# Patient Record
Sex: Female | Born: 1980 | Race: White | Hispanic: No | Marital: Married | Smoking: Never smoker
Health system: Southern US, Community
[De-identification: ages and names within clinical notes are randomized; demographics above are authoritative.]

## PROBLEM LIST (undated history)

## (undated) DIAGNOSIS — D649 Anemia, unspecified: Secondary | ICD-10-CM

## (undated) DIAGNOSIS — N61 Mastitis without abscess: Secondary | ICD-10-CM

## (undated) DIAGNOSIS — K5190 Ulcerative colitis, unspecified, without complications: Secondary | ICD-10-CM

## (undated) HISTORY — DX: Ulcerative colitis, unspecified, without complications: K51.90

## (undated) HISTORY — DX: Mastitis without abscess: N61.0

## (undated) HISTORY — DX: Anemia, unspecified: D64.9

---

## 2003-08-11 ENCOUNTER — Ambulatory Visit (HOSPITAL_COMMUNITY): Admission: RE | Admit: 2003-08-11 | Discharge: 2003-08-11 | Payer: Self-pay | Admitting: Obstetrics and Gynecology

## 2004-02-10 ENCOUNTER — Encounter (INDEPENDENT_AMBULATORY_CARE_PROVIDER_SITE_OTHER): Payer: Self-pay | Admitting: Specialist

## 2004-02-10 ENCOUNTER — Ambulatory Visit (HOSPITAL_BASED_OUTPATIENT_CLINIC_OR_DEPARTMENT_OTHER): Admission: RE | Admit: 2004-02-10 | Discharge: 2004-02-10 | Payer: Self-pay | Admitting: Obstetrics and Gynecology

## 2004-02-10 ENCOUNTER — Ambulatory Visit (HOSPITAL_COMMUNITY): Admission: RE | Admit: 2004-02-10 | Discharge: 2004-02-10 | Payer: Self-pay | Admitting: Obstetrics and Gynecology

## 2004-10-30 HISTORY — PX: BARTHOLIN GLAND CYST EXCISION: SHX565

## 2005-01-28 ENCOUNTER — Emergency Department (HOSPITAL_COMMUNITY): Admission: EM | Admit: 2005-01-28 | Discharge: 2005-01-28 | Payer: Self-pay | Admitting: Family Medicine

## 2006-07-27 ENCOUNTER — Encounter (INDEPENDENT_AMBULATORY_CARE_PROVIDER_SITE_OTHER): Payer: Self-pay | Admitting: Specialist

## 2006-07-27 ENCOUNTER — Ambulatory Visit (HOSPITAL_COMMUNITY): Admission: RE | Admit: 2006-07-27 | Discharge: 2006-07-27 | Payer: Self-pay | Admitting: Obstetrics and Gynecology

## 2008-06-11 ENCOUNTER — Inpatient Hospital Stay (HOSPITAL_COMMUNITY): Admission: AD | Admit: 2008-06-11 | Discharge: 2008-06-13 | Payer: Self-pay | Admitting: Obstetrics and Gynecology

## 2010-10-30 NOTE — L&D Delivery Note (Signed)
Delivery Note At 9:50 PM a healthy female was delivered via Vaginal, Spontaneous Delivery (Presentation: ;  ).  APGAR: 9, 9; weight 8 lb 13.6 oz (4015 g).   Placenta status: Intact, Spontaneous.  Cord: 3 vessels with the following complications: None.  Cord pH: not done  Anesthesia: Epidural  Episiotomy: None Lacerations: 2nd degree Suture Repair: vicryl vicryl rapide 2-0, 3-0 Est. Blood Loss (mL): 300  Mom to postpartum.  Baby to nursery-stable.  Robynne Roat,MARIE-LYNE 09/08/2011, 10:13 PM

## 2011-03-14 NOTE — H&P (Signed)
NAMEHANAAN, GANCARZ              ACCOUNT NO.:  [id number]   MEDICAL RECORD NO.:  [id number]          PATIENT TYPE:  INP   LOCATION:  9101                          FACILITY:  [facility]   PHYSICIAN:  Lovenia Kim, M.D.DATE OF BIRTH:  07-28-1981   DATE OF ADMISSION:  06/11/2008  DATE OF DISCHARGE:                              HISTORY & PHYSICAL   CHIEF COMPLAINT:  Labor.   She is a 30 year old white female, primiparous at term, at 65 plus weeks  with spontaneous rupture of membranes at midnight and active labor.   ALLERGIES:  She has no known drug allergies.   MEDICATIONS:  Prenatal vitamins.   FAMILY HISTORY:  She has a family history of breast, prostate, colon,  and lymphoid cancer.   SOCIAL HISTORY:  She is a nonsmoker, nondrinker.  She denies domestic or  physical violence.   She had 1 uncomplicated spontaneous abortion in 2006.  Her prenatal  course is again complicated by an estimated fetal weight of  approximately in the 90th percentile, which has been consistent with  trimester and occasional elevation of blood pressure.   PHYSICAL EXAMINATION:  GENERAL:  She is a well-developed and well-  nourished white female, in no acute distress.  HEENT:  Normal.  LUNGS:  Clear.  HEART:  Regular.  ABDOMEN:  Soft, gravid, and nontender.  Estimated fetal weight 8.5  pounds.  Cervix is 10 cm, 100%, vertex, +2.  EXTREMITIES:  No cords.  NEUROLOGIC:  Nonfocal.  SKIN:  Intact.   IMPRESSION:  1. Term intrauterine pregnancy in active labor.  2. Presumptive large for gestational age.  3. Group B Streptococcus positive, on prophylaxis.   PLAN:  Anticipated vaginal delivery.      Lovenia Kim, M.D.  Electronically Signed     RJT/MEDQ  D:  06/11/2008  T:  06/12/2008  Job:  [id number]

## 2011-03-14 NOTE — Op Note (Signed)
Barbara Hall, Barbara Hall              ACCOUNT NO.:  [id number]   MEDICAL RECORD NO.:  [id number]          PATIENT TYPE:  INP   LOCATION:  9101                          FACILITY:  [facility]   PHYSICIAN:  Lovenia Kim, M.D.DATE OF BIRTH:  12/24/1980   DATE OF PROCEDURE:  06/11/2008  DATE OF DISCHARGE:                               OPERATIVE REPORT   INDICATION FOR OPERATIVE DELIVERY:  Maternal exhaustion and prolonged  second stage.   POSTOPERATIVE DIAGNOSES:  1. Maternal exhaustion.  2. Prolonged second stage.   PROCEDURE:  Outlet vacuum-assisted vaginal delivery with Kiwi cup x1  pull.   SURGEON:  Lovenia Kim, MD   ANESTHESIA:  Epidural.   ESTIMATED BLOOD LOSS:  600 mL.   COMPLICATIONS:  None.   DRAINS:  None.   COUNTS:  Correct.   The patient to Recovery in good condition.   FINDINGS:  Full-term living female, Apgars 8 and 23.  Placenta delivered  spontaneously intact.  A 3-vessel cord noted.  Third-degree laceration,  with repair.   DESCRIPTION OF PROCEDURE:  After being apprised of the risks and  benefits of vacuum assistance to include small instance of scalp  laceration and intracranial hemorrhage and cephalohematoma, Kiwi cup was  placed, fetal vertex OA, +3/+4 station for one pull of vacuum-assisted  delivery of a full-term living female over a third-degree laceration,  controlled, Apgars 8 and 9.  Placenta delivered spontaneously intact, 3-  vessel cord noted.  Bulb suctioning performed, repaired with a 3-0  Rapide and a 0 Vicryl for sphincter repair.  Good hemostasis is noted.  Rectum is intact.  No complications.  No cervical lacerations.  The  patient tolerated the procedure well and is recovering with baby in good  condition.     Lovenia Kim, M.D.  Electronically Signed    RJT/MEDQ  D:  06/11/2008  T:  06/12/2008  Job:  [id number]

## 2011-03-17 NOTE — Op Note (Signed)
NAME:  Barbara Hall, Barbara Hall                         ACCOUNT NO.:  [id number]   MEDICAL RECORD NO.:  [id number]                   PATIENT TYPE:  AMB   LOCATION:  [facility]                                 FACILITY:  [facility]   PHYSICIAN:  Selinda Orion, M.D.               DATE OF BIRTH:  1981-09-05   DATE OF PROCEDURE:  02/10/2004  DATE OF DISCHARGE:                                 OPERATIVE REPORT   PREOPERATIVE DIAGNOSIS:  Labial cyst.   POSTOPERATIVE DIAGNOSIS:  Bartholin's gland cyst.   OPERATION/PROCEDURE:  Drainage of cyst and marsupialization.   INDICATIONS:  The patient is a 30 year old female who had Bartholin's  abscess, was drained and developed a large cyst in her labia.  I thought  this was simply a simple inclusion cyst.  However, it appears that she may  have a Bartholin's cyst.   DESCRIPTION OF PROCEDURE:  The patient was prepped and draped in the usual  fashion.  A midline incision was made over the labia and the Bartholin's  gland opened.  It was a large, 3 cm cyst.  It contained the typical clear  Bartholin's fluid.  I excised some of the cyst wall for diagnosis and then  drained the cyst vaginally by placing a hemostat inside the Bartholin's cyst  and draining it, finding an opening in the vaginal mucosa.  The Bartholin's  gland was then sutured to the vaginal mucosa with interrupted sutures to  bring it to enhance drainage.  The labial incision was then closed with a  subcuticular 4-0 Vicryl and the marsupialized area was closed with  interrupted sutures of 4-0 Vicryl.  The hemostat was left in place to be  sure that it communicated with the area satisfactorily and it did.  Hemostasis appeared secure.  I infiltrated the gland with some Marcaine.  Satcha tolerated this procedure well.  Was sent to the recovery room in  good condition.                                               Selinda Orion, M.D.    SDM/MEDQ  D:  02/10/2004  T:  02/10/2004  Job:  [id number]

## 2011-03-17 NOTE — Op Note (Signed)
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   POSTOPERATIVE DIAGNOSIS:  Persistent Bartholin abscess refractory to  marsupialization x2.   PROCEDURE:  Excision of Bartholin cyst and Bartholin's gland.   SURGEON:  Lovenia Kim, M.D.   ASSISTANT:  __________   ESTIMATED BLOOD LOSS:  100 mL.   COMPLICATIONS:  None.   ANESTHESIA:  General.   SPECIMENS:  Bartholin abscess, cyst, and glands to pathology.   DISPOSITION:  Patient to recovery in good condition.   BRIEF OP NOTE:  The patient apprised of the risks of anesthesia, infection,  bleeding, inability to completely remove the Bartholin cyst with increased  risks for blood loss, and possible perineal discomfort.  The patient was  brought to the operating room where she was administered general anesthetic  for complications and prepped and draped in the usual sterile fashion.  Patient was catheterized until the bladder was empty.  Examination reveals a  3 x 4 cm Bartholin abscess previously marsupialized.  At this time a linear  incision is made over the labia minora over the top of the cyst.  The  subcutaneous tissue is dissected sharply and the cyst and gland are  enucleated dissecting around the cyst and keeping it intact until reaching  the base, at which point the base is excised from the cavity where the gland  is lying and the bleeding was in the bases controlled using electrocautery  and multiple interrupted sutures of #3-0 Vicryl are placed achieving good  hemostasis.  The dead space is further closed using 3-0 Vicryl  interrupted  mattress sutures.  At this time having achieved good hemostasis the labia  minora is closed using a subcutaneous  stitch of a 4-0 Vicryl in a continuous running fashion and a subcuticular  fashion.  Good hemostasis is noted.  The patient tolerates the procedure  well and was transferred to recovery in good condition.   DESCRIPTION OF PROCEDURE:  __________      Lovenia Kim, M.D.  Electronically Signed     RJT/MEDQ  D:  07/27/2006  T:  07/29/2006  Job:  [id number]

## 2011-03-17 NOTE — Op Note (Signed)
   NAME:  Barbara Hall, Barbara Hall                         ACCOUNT NO.:  [id number]   MEDICAL RECORD NO.:  [id number]                   PATIENT TYPE:  AMB   LOCATION:  [facility]                                  FACILITY:  [facility]   PHYSICIAN:  Selinda Orion, M.D.               DATE OF BIRTH:  06/04/1981   DATE OF PROCEDURE:  08/11/2003  DATE OF DISCHARGE:                                 OPERATIVE REPORT   PREOPERATIVE DIAGNOSIS:  Labial abscess.   POSTOPERATIVE DIAGNOSIS:  Labial abscess, probable Bartholin's abscess.   OPERATION:  Incision and drainage of abscess and insertion of Word catheter.   The patient was placed in the supine position and prepped and draped and  anesthetized, and then the labial abscess was draining.  This was enlarged  and a Word catheter was inserted and sutured in with one suture of 3-0  Vicryl.  The area was then injected with 1% Xylocaine with epinephrine.  No  unusual blood loss occurred.  Kijana tolerated this procedure well.                                               Selinda Orion, M.D.    SDM/MEDQ  D:  08/11/2003  T:  08/11/2003  Job:  [id number]

## 2011-03-17 NOTE — H&P (Signed)
NAMECASSADIE, PANKONIN               ACCOUNT NO.:  [id number]   MEDICAL RECORD NO.:  [id number]          PATIENT TYPE:  AMB   LOCATION:  [place]                           FACILITY:  [facility]   PHYSICIAN:  Lovenia Kim, M.D.DATE OF BIRTH:  05-18-1981   DATE OF ADMISSION:  DATE OF DISCHARGE:                                HISTORY & PHYSICAL   A 30 year old white female with a persistent Bartholin cyst. She has no  known drug allergies.   MEDICATIONS:  None.   FAMILY HISTORY:  Breast cancer, colon cancer, prostate cancer. She has a  history of Bartholin cyst with cystectomy in 2005. She has a noncontributory  pregnancy history.   PHYSICAL EXAMINATION:  GENERAL:  She is a well-developed, well-nourished,  white female in no acute distress.  HEENT:  Normal.  LUNGS:  Clear.  HEART:  Regular rate and rhythm.  ABDOMEN:  Soft, nontender.  PELVIC:  Reveals a 3 x 4 tender inflamed Bartholin cyst which is status post  surgery x2.  EXTREMITIES:  Revealed no cords.  NEUROLOGIC:  Nonfocal.   IMPRESSION:  Persistent right Bartholin abscess.   PLAN:  Will proceed with excision of Bartholin abscess. The risks of  anesthesia, infection, bleeding, injury to abdominal organs and need for  repair was discussed. Delayed versus immediate complications to include  bowel and bladder injury were noted, inability to completely remove cyst  noted. The patient acknowledges and wishes to proceed.      Lovenia Kim, M.D.  Electronically Signed     RJT/MEDQ  D:  07/26/2006  T:  07/27/2006  Job:  [id number]

## 2011-07-28 LAB — CBC
HCT: 28 — ABNORMAL LOW
HCT: 36.6
Hemoglobin: 12.1
Hemoglobin: 9.5 — ABNORMAL LOW
MCHC: 33
MCHC: 33.8
MCV: 87
MCV: 87.3
Platelets: 165
Platelets: 216
RBC: 3.22 — ABNORMAL LOW
RBC: 4.19
RDW: 14.1
RDW: 14.6
WBC: 11.9 — ABNORMAL HIGH
WBC: 13.3 — ABNORMAL HIGH

## 2011-07-28 LAB — RPR: RPR Ser Ql: NONREACTIVE

## 2011-07-28 LAB — CCBB MATERNAL DONOR DRAW

## 2011-09-08 ENCOUNTER — Inpatient Hospital Stay (HOSPITAL_COMMUNITY)
Admission: AD | Admit: 2011-09-08 | Discharge: 2011-09-10 | DRG: 373 | Disposition: A | Discharge status: Home / Self Care | Payer: BC Managed Care – PPO | Source: Ambulatory Visit | Attending: Obstetrics & Gynecology | Admitting: Obstetrics & Gynecology

## 2011-09-08 ENCOUNTER — Encounter (HOSPITAL_COMMUNITY): Payer: Self-pay | Admitting: *Deleted

## 2011-09-08 ENCOUNTER — Encounter (HOSPITAL_COMMUNITY): Payer: Self-pay | Admitting: Anesthesiology

## 2011-09-08 ENCOUNTER — Inpatient Hospital Stay (HOSPITAL_COMMUNITY): Payer: BC Managed Care – PPO | Admitting: Anesthesiology

## 2011-09-08 DIAGNOSIS — Z22330 Carrier of Group B streptococcus: Secondary | ICD-10-CM

## 2011-09-08 DIAGNOSIS — O99892 Other specified diseases and conditions complicating childbirth: Secondary | ICD-10-CM | POA: Diagnosis present

## 2011-09-08 LAB — CBC
HCT: 37.3 % (ref 36.0–46.0)
Hemoglobin: 11.6 g/dL — ABNORMAL LOW (ref 12.0–15.0)
MCH: 23.9 pg — ABNORMAL LOW (ref 26.0–34.0)
MCHC: 31.1 g/dL (ref 30.0–36.0)
MCV: 76.7 fL — ABNORMAL LOW (ref 78.0–100.0)
Platelets: 277 10*3/uL (ref 150–400)
RBC: 4.86 MIL/uL (ref 3.87–5.11)
RDW: 17.3 % — ABNORMAL HIGH (ref 11.5–15.5)
WBC: 9 10*3/uL (ref 4.0–10.5)

## 2011-09-08 LAB — HIV ANTIBODY (ROUTINE TESTING W REFLEX)
HIV: NONREACTIVE
HIV: NONREACTIVE

## 2011-09-08 LAB — ANTIBODY SCREEN: Antibody Screen: NEGATIVE

## 2011-09-08 LAB — RUBELLA ANTIBODY, IGM: Rubella: IMMUNE

## 2011-09-08 LAB — ABO/RH: RH Type: POSITIVE

## 2011-09-08 LAB — HEPATITIS B SURFACE ANTIGEN: Hepatitis B Surface Ag: NEGATIVE

## 2011-09-08 LAB — RPR
RPR: NONREACTIVE
RPR: NONREACTIVE

## 2011-09-08 LAB — STREP B DNA PROBE: GBS: POSITIVE

## 2011-09-08 MED ORDER — DIBUCAINE 1 % RE OINT
1.0000 "application " | TOPICAL_OINTMENT | RECTAL | Status: DC | PRN
  Filled 2011-09-08: qty 28

## 2011-09-08 MED ORDER — EPHEDRINE 5 MG/ML INJ
10.0000 mg | INTRAVENOUS | Status: DC | PRN
  Filled 2011-09-08: qty 4

## 2011-09-08 MED ORDER — PHENYLEPHRINE 40 MCG/ML (10ML) SYRINGE FOR IV PUSH (FOR BLOOD PRESSURE SUPPORT): 80.0000 ug | PREFILLED_SYRINGE | INTRAVENOUS | Status: DC | PRN

## 2011-09-08 MED ORDER — PENICILLIN G POTASSIUM 5000000 UNITS IJ SOLR
5.0000 10*6.[IU] | Freq: Once | INTRAMUSCULAR | Status: AC
  Administered 2011-09-08: 5 10*6.[IU] via INTRAVENOUS
  Filled 2011-09-08: qty 5

## 2011-09-08 MED ORDER — LIDOCAINE HCL 1.5 % IJ SOLN
INTRAMUSCULAR | Status: DC | PRN
  Administered 2011-09-08 (×2): 5 mL via EPIDURAL

## 2011-09-08 MED ORDER — DIPHENHYDRAMINE HCL 25 MG PO CAPS: 25.0000 mg | ORAL_CAPSULE | Freq: Four times a day (QID) | ORAL | Status: DC | PRN

## 2011-09-08 MED ORDER — LACTATED RINGERS IV SOLN: 500.0000 mL | INTRAVENOUS | Status: DC | PRN

## 2011-09-08 MED ORDER — PENICILLIN G POTASSIUM 5000000 UNITS IJ SOLR
2.5000 10*6.[IU] | INTRAVENOUS | Status: DC
  Filled 2011-09-08 (×5): qty 2.5

## 2011-09-08 MED ORDER — CITRIC ACID-SODIUM CITRATE 334-500 MG/5ML PO SOLN: 30.0000 mL | ORAL | Status: DC | PRN

## 2011-09-08 MED ORDER — ACETAMINOPHEN 325 MG PO TABS: 650.0000 mg | ORAL_TABLET | ORAL | Status: DC | PRN

## 2011-09-08 MED ORDER — LACTATED RINGERS IV SOLN: 500.0000 mL | Freq: Once | INTRAVENOUS | Status: DC

## 2011-09-08 MED ORDER — FLEET ENEMA 7-19 GM/118ML RE ENEM: 1.0000 | ENEMA | RECTAL | Status: DC | PRN

## 2011-09-08 MED ORDER — WITCH HAZEL-GLYCERIN EX PADS: 1.0000 "application " | MEDICATED_PAD | CUTANEOUS | Status: DC | PRN

## 2011-09-08 MED ORDER — SIMETHICONE 80 MG PO CHEW: 80.0000 mg | CHEWABLE_TABLET | ORAL | Status: DC | PRN

## 2011-09-08 MED ORDER — IBUPROFEN 600 MG PO TABS
600.0000 mg | ORAL_TABLET | Freq: Four times a day (QID) | ORAL | Status: DC
  Administered 2011-09-09 – 2011-09-10 (×5): 600 mg via ORAL
  Filled 2011-09-08 (×5): qty 1

## 2011-09-08 MED ORDER — DIPHENHYDRAMINE HCL 50 MG/ML IJ SOLN: 12.5000 mg | INTRAMUSCULAR | Status: DC | PRN

## 2011-09-08 MED ORDER — ONDANSETRON HCL 4 MG/2ML IJ SOLN: 4.0000 mg | Freq: Four times a day (QID) | INTRAMUSCULAR | Status: DC | PRN

## 2011-09-08 MED ORDER — EPHEDRINE 5 MG/ML INJ: 10.0000 mg | INTRAVENOUS | Status: DC | PRN

## 2011-09-08 MED ORDER — OXYTOCIN 20 UNITS IN LACTATED RINGERS INFUSION - SIMPLE
125.0000 mL/h | Freq: Once | INTRAVENOUS | Status: AC
  Administered 2011-09-08: 125 mL/h via INTRAVENOUS

## 2011-09-08 MED ORDER — OXYTOCIN BOLUS FROM INFUSION
500.0000 mL | Freq: Once | INTRAVENOUS | Status: DC
  Filled 2011-09-08: qty 500
  Filled 2011-09-08: qty 1000

## 2011-09-08 MED ORDER — BENZOCAINE-MENTHOL 20-0.5 % EX AERO
1.0000 "application " | INHALATION_SPRAY | CUTANEOUS | Status: DC | PRN
  Administered 2011-09-09: 1 via TOPICAL
  Filled 2011-09-08: qty 56

## 2011-09-08 MED ORDER — OXYTOCIN 20 UNITS IN LACTATED RINGERS INFUSION - SIMPLE: 125.0000 mL/h | INTRAVENOUS | Status: DC | PRN

## 2011-09-08 MED ORDER — ONDANSETRON HCL 4 MG/2ML IJ SOLN: 4.0000 mg | INTRAMUSCULAR | Status: DC | PRN

## 2011-09-08 MED ORDER — LACTATED RINGERS IV SOLN
INTRAVENOUS | Status: DC
  Administered 2011-09-08: 19:00:00 via INTRAVENOUS

## 2011-09-08 MED ORDER — IBUPROFEN 600 MG PO TABS
600.0000 mg | ORAL_TABLET | Freq: Four times a day (QID) | ORAL | Status: DC | PRN
  Administered 2011-09-08: 600 mg via ORAL
  Filled 2011-09-08 (×2): qty 1

## 2011-09-08 MED ORDER — OXYCODONE-ACETAMINOPHEN 5-325 MG PO TABS
1.0000 | ORAL_TABLET | ORAL | Status: DC | PRN
  Administered 2011-09-09: 1 via ORAL
  Filled 2011-09-08: qty 1

## 2011-09-08 MED ORDER — FENTANYL 2.5 MCG/ML BUPIVACAINE 1/10 % EPIDURAL INFUSION (WH - ANES)
14.0000 mL/h | INTRAMUSCULAR | Status: DC
Start: 2011-09-08 — End: 2011-09-09
  Administered 2011-09-08: 14 mL/h via EPIDURAL
  Filled 2011-09-08: qty 60

## 2011-09-08 MED ORDER — ONDANSETRON HCL 4 MG PO TABS: 4.0000 mg | ORAL_TABLET | ORAL | Status: DC | PRN

## 2011-09-08 MED ORDER — PRENATAL PLUS 27-1 MG PO TABS
1.0000 | ORAL_TABLET | Freq: Every day | ORAL | Status: DC
  Administered 2011-09-09 – 2011-09-10 (×2): 1 via ORAL
  Filled 2011-09-08 (×2): qty 1

## 2011-09-08 MED ORDER — ZOLPIDEM TARTRATE 5 MG PO TABS: 5.0000 mg | ORAL_TABLET | Freq: Every evening | ORAL | Status: DC | PRN

## 2011-09-08 MED ORDER — DEXTROSE 5 % IV SOLN: 4.0000 10*6.[IU] | INTRAVENOUS | Status: DC

## 2011-09-08 MED ORDER — OXYCODONE-ACETAMINOPHEN 5-325 MG PO TABS: 2.0000 | ORAL_TABLET | ORAL | Status: DC | PRN

## 2011-09-08 MED ORDER — PHENYLEPHRINE 40 MCG/ML (10ML) SYRINGE FOR IV PUSH (FOR BLOOD PRESSURE SUPPORT)
80.0000 ug | PREFILLED_SYRINGE | INTRAVENOUS | Status: DC | PRN
  Filled 2011-09-08: qty 5

## 2011-09-08 MED ORDER — SENNOSIDES-DOCUSATE SODIUM 8.6-50 MG PO TABS: 2.0000 | ORAL_TABLET | Freq: Every day | ORAL | Status: DC

## 2011-09-08 MED ORDER — LIDOCAINE HCL (PF) 1 % IJ SOLN
30.0000 mL | INTRAMUSCULAR | Status: DC | PRN
  Filled 2011-09-08: qty 30

## 2011-09-08 NOTE — Anesthesia Procedure Notes (Signed)

## 2011-09-08 NOTE — Progress Notes (Signed)
Pt states, " I've ben contracting since 3:30 pm and they are every 4 min. I was 4 cm in the office yesterday."

## 2011-09-08 NOTE — H&P (Signed)
Barbara Hall is a 30 y.o. female G3P1A1 38w4dpresenting for spontaneous labor.  RP:  Reg UCs   Obstetrics:  G3P1A1  SVD 38+ wks girl 8+ Lbs, no Cx  Past Medical History  Diagnosis Date  . No pertinent past medical history    Past Surgical History  Procedure Date  . Bartholin gland cyst excision 2006   Family History: family history is not on file. Social History:  reports that she has never smoked. She has never used smokeless tobacco. Her alcohol and drug histories not on file. Current facility-administered medications:acetaminophen (TYLENOL) tablet 650 mg, 650 mg, Oral, Q4H PRN, DDerrell Lolling  citric acid-sodium citrate (ORACIT) solution 30 mL, 30 mL, Oral, Q2H PRN, DDerrell Lolling  diphenhydrAMINE (BENADRYL) injection 12.5 mg, 12.5 mg, Intravenous, Q15 min PRN, FLucy Antigua MD;  ePHEDrine injection 10 mg, 10 mg, Intravenous, PRN, FLucy Antigua MD ePHEDrine injection 10 mg, 10 mg, Intravenous, PRN, FLucy Antigua MD;  fentaNYL 2.5 mcg/ml w/bupivacaine 0.1% epidural (60 ml syringe), 14 mL/hr, Epidural, Continuous, FLucy Antigua MD, Last Rate: 14 mL/hr at 09/08/11 1937, 14 mL/hr at 09/08/11 1937;  ibuprofen (ADVIL,MOTRIN) tablet 600 mg, 600 mg, Oral, Q6H PRN, DDerrell Lolling  lactated ringers infusion 500 mL, 500 mL, Intravenous, Once, FLucy Antigua MD lactated ringers infusion 500-1,000 mL, 500-1,000 mL, Intravenous, PRN, DDerrell Lolling  lactated ringers infusion, , Intravenous, Continuous, DDerrell Lolling Last Rate: 125 mL/hr at 09/08/11 1845;  lidocaine (XYLOCAINE) 1 % injection 30 mL, 30 mL, Subcutaneous, PRN, DDerrell Lolling  ondansetron (ZOFRAN) injection 4 mg, 4 mg, Intravenous, Q6H PRN, DDerrell LollingoxyCODONE-acetaminophen (PERCOCET) 5-325 MG per tablet 2 tablet, 2 tablet, Oral, Q3H PRN, DDerrell Lolling  oxytocin (PITOCIN) IV BOLUS FROM BAG, 500 mL, Intravenous, Once, DDerrell Lolling  oxytocin (PITOCIN) IV infusion 20 units in LR 1000 mL, 125 mL/hr, Intravenous, Once,  DDerrell Lolling  penicillin G potassium 2.5 Million Units in dextrose 5 % 100 mL IVPB, 2.5 Million Units, Intravenous, Q4H, Marie-Lyne Danijah Noh penicillin G potassium 5 Million Units in dextrose 5 % 250 mL IVPB, 5 Million Units, Intravenous, Once, DDerrell Lolling 5 Million Units at 09/08/11 1932;  phenylephrine injection 80 mcg, 80 mcg, Intravenous, PRN, FLucy Antigua MD;  phenylephrine injection 80 mcg, 80 mcg, Intravenous, PRN, FLucy Antigua MD;  sodium phosphate (FLEET) 7-19 GM/118ML enema 1 enema, 1 enema, Rectal, PRN, DDerrell LollingDISCONTD: penicillin G potassium 4 Million Units in dextrose 5 % 250 mL IVPB, 4 Million Units, Intravenous, Q4H, Daniela Paul Facility-Administered Medications Ordered in Other Encounters: lidocaine 1.5 % injection, , , PRN, FLucy Antigua MD, 5 mL at 09/08/11 1935 No Known Allergies  Dilation: 10 Effacement (%): 100 Station: +1 Exam by:: harker RN Blood pressure 138/77, pulse 93, temperature 98 F (36.7 C), temperature source Oral, resp. rate 20, height 5' 7"  (1.702 m), weight 81.761 kg (180 lb 4 oz), SpO2 100.00%, unknown if currently breastfeeding. SROM clear fluid in L+D  HPP: There is no problem list on file for this patient.   Prenatal labs: ABO, Rh: A/Positive/-- (11/09 1855) Antibody: Negative (11/09 1855) Rubella:  Immune RPR: Nonreactive, Nonreactive (11/09 1855)  HBsAg: Negative (11/09 1855)  HIV: Non-reactive, Non-reactive (11/09 1855)  Genetic testing: Quad neg [place]: wnl 1 hr GTT: wnl GBS: Positive (11/09 1855)  Pen G received in labor.  Assessment/Plan: G3P1A1 38+ wks with spontaneous labor/SROM ready for delivery.  FHR monitoring reassuring. GBS pos, Pen G received.   Mell Mellott,MARIE-LYNE 09/08/2011, 10:20 PM

## 2011-09-08 NOTE — Anesthesia Preprocedure Evaluation (Signed)
Anesthesia Evaluation  Patient identified by MRN, date of birth, ID band Patient awake    Reviewed: Allergy & Precautions, H&P , Patient's Chart, lab work & pertinent test results  Airway Mallampati: II TM Distance: >3 FB Neck ROM: full    Dental No notable dental hx.    Pulmonary neg pulmonary ROS,  clear to auscultation  Pulmonary exam normal       Cardiovascular neg cardio ROS regular Normal    Neuro/Psych Negative Neurological ROS  Negative Psych ROS   GI/Hepatic negative GI ROS, Neg liver ROS,   Endo/Other  Negative Endocrine ROS  Renal/GU negative Renal ROS     Musculoskeletal   Abdominal   Peds  Hematology negative hematology ROS (+)   Anesthesia Other Findings   Reproductive/Obstetrics (+) Pregnancy                           Anesthesia Physical Anesthesia Plan  ASA: II  Anesthesia Plan: Epidural   Post-op Pain Management:    Induction:   Airway Management Planned:   Additional Equipment:   Intra-op Plan:   Post-operative Plan:   Informed Consent: I have reviewed the patients History and Physical, chart, labs and discussed the procedure including the risks, benefits and alternatives for the proposed anesthesia with the patient or authorized representative who has indicated his/her understanding and acceptance.     Plan Discussed with:   Anesthesia Plan Comments:         Anesthesia Quick Evaluation

## 2011-09-09 ENCOUNTER — Encounter (HOSPITAL_COMMUNITY): Payer: Self-pay

## 2011-09-09 LAB — CBC
HCT: 30.2 % — ABNORMAL LOW (ref 36.0–46.0)
Hemoglobin: 9.4 g/dL — ABNORMAL LOW (ref 12.0–15.0)
MCH: 24.6 pg — ABNORMAL LOW (ref 26.0–34.0)
MCHC: 31.8 g/dL (ref 30.0–36.0)
MCV: 77.4 fL — ABNORMAL LOW (ref 78.0–100.0)
Platelets: 204 10*3/uL (ref 150–400)
RBC: 3.9 MIL/uL (ref 3.87–5.11)
RDW: 17.3 % — ABNORMAL HIGH (ref 11.5–15.5)
WBC: 16.2 10*3/uL — ABNORMAL HIGH (ref 4.0–10.5)

## 2011-09-09 LAB — RPR: RPR Ser Ql: NONREACTIVE

## 2011-09-09 MED ORDER — METHYLERGONOVINE MALEATE 0.2 MG PO TABS: 0.2000 mg | ORAL_TABLET | ORAL | Status: DC | PRN

## 2011-09-09 MED ORDER — METHYLERGONOVINE MALEATE 0.2 MG/ML IJ SOLN: 0.2000 mg | INTRAMUSCULAR | Status: DC | PRN

## 2011-09-09 MED ORDER — LANOLIN HYDROUS EX OINT: TOPICAL_OINTMENT | CUTANEOUS | Status: DC | PRN

## 2011-09-09 MED ORDER — BENZOCAINE-MENTHOL 20-0.5 % EX AERO
INHALATION_SPRAY | CUTANEOUS | Status: AC
  Filled 2011-09-09: qty 56

## 2011-09-09 MED ORDER — TETANUS-DIPHTH-ACELL PERTUSSIS 5-2.5-18.5 LF-MCG/0.5 IM SUSP
0.5000 mL | Freq: Once | INTRAMUSCULAR | Status: AC
  Administered 2011-09-09: 0.5 mL via INTRAMUSCULAR
  Filled 2011-09-09: qty 0.5

## 2011-09-09 NOTE — Progress Notes (Signed)
PPD 1 SVD  S:  Reports feeling well.             Tolerating po/ No nausea or vomiting             Bleeding is moderate             Pain controlled withprescription NSAID's including motrin             Up ad lib / ambulatory  Newborn  Information for the patient's newborn:  Keniah, Klemmer [[id number]]  female  breast feeding  / Circumcision in progress   O:  A & O x 3 NAD             VS: Blood pressure 118/83, pulse 73, temperature 97.7 F (36.5 C), temperature source Oral, resp. rate 17, height 5' 7"  (1.702 m), weight 81.761 kg (180 lb 4 oz), SpO2 100.00%, unknown if currently breastfeeding.  LABS:  Basename 09/09/11 0601 09/08/11 1850  HGB 9.4* 11.6*  HCT 30.2* 37.3    I&O: I/O last 3 completed shifts: In: -  Out: 300 [Blood:300]      Lungs: Clear and unlabored  Heart: regular rate and rhythm / no mumurs  Abdomen: soft, non-tender, non-distended              Fundus: firm, non-tender, U-1  Perineum: repair intact, mild edema  Lochia: small  Extremities: trace edema, no calf pain or tenderness, neg Homans    A/P: PPD # 1 30 y.o., F7P1025 S/P:spontaneous vaginal   Principal Problem:  *Postpartum examination following vaginal delivery (11/9)   Doing well - stable status  Routine post partum orders  Anticipate discharge home in AM.   PAUL,DANIELA, CNM, MSN 09/09/2011, 11:31 AM

## 2011-09-09 NOTE — Progress Notes (Signed)
Moving pt to room 173

## 2011-09-10 NOTE — Addendum Note (Signed)
Addendum  created 09/10/11 0931 by Lucy Antigua, MD   Modules edited:Notes Section

## 2011-09-10 NOTE — Anesthesia Postprocedure Evaluation (Signed)
Anesthesia Post Note  Patient: Barbara Hall  Procedure(s) Performed: * No procedures listed *  Anesthesia type: Epidural  Patient location: Mother/Baby  Post pain: Pain level controlled  Post assessment: Post-op Vital signs reviewed  Last Vitals:  Filed Vitals:   09/10/11 0540  BP: 111/74  Pulse: 73  Temp: 36.7 C  Resp: 18    Post vital signs: Reviewed  Level of consciousness: awake  Complications: No apparent anesthesia complications

## 2011-09-10 NOTE — Discharge Summary (Signed)
   Obstetric Discharge Summary Reason for Admission: onset of labor Prenatal Procedures: none Intrapartum Procedures: spontaneous vaginal delivery Postpartum Procedures: none Complications-Operative and Postpartum: none  Temp:  [97.9 F (36.6 C)-98.3 F (36.8 C)] 98 F (36.7 C) (11/11 0540) Pulse Rate:  [73-74] 73  (11/11 0540) Resp:  [18-20] 18  (11/11 0540) BP: (111-116)/(74-80) 111/74 mmHg (11/11 0540) Hemoglobin  Date Value Range Status  09/09/2011 9.4* 12.0-15.0 (g/dL) Final     REPEATED TO VERIFY     DELTA CHECK NOTED     HCT  Date Value Range Status  09/09/2011 30.2* 36.0-46.0 (%) Final    Hospital Course:  Good normal postpartum  Discharge Diagnoses: Term Pregnancy-delivered  Discharge Information: Date: 09/10/2011 Activity: Pelvic rest until postpartum visit Diet: routine Medications:  Medication List  As of 09/10/2011  9:38 AM   CONTINUE taking these medications         acetaminophen 325 MG tablet   Commonly known as: TYLENOL      prenatal vitamin w/FE, FA 27-1 MG Tabs           Condition: stable Instructions: refer to practice specific booklet Discharge to: home Follow-up Information    Follow up with Lovenia Kim, MD in 6 weeks.   Contact information:   [facility] [phone]          Newborn Data: Live born  Information for the patient's newborn:  Barbara Hall, Barbara Hall [[id number]]  female circumcised ; APGAR 9-9  ; weight  8+ Lbs Home with mother.  Barbara Hall,MARIE-LYNE 09/10/2011, 9:38 AM

## 2011-09-10 NOTE — Progress Notes (Signed)
PPD #2 SVD  S:  Pt reports feeling well/ Tolerating po/ Voiding without problems/ No n/v/ Bleeding is light/ Pain controlled withibuprofen (OTC)  Newborn  Well, breastfeeding and formula.  D/C after BM today.   O:  A & O x 3 , NAD Filed Vitals:   09/10/11 0540  BP: 111/74  Pulse: 73  Temp: 98 F (36.7 C)  Resp: 18    LABS:  Lab Results  Component Value Date   WBC 16.2* 09/09/2011   HGB 9.4* 09/09/2011   HCT 30.2* 09/09/2011   MCV 77.4* 09/09/2011   PLT 204 09/09/2011      Lungs: clear  Heart: rcr  Abdomen: soft  Perineum: intact  UH: 0/2 firm  Lochia: normal  Extremities: normal    A/P: PPD # 2/ [id number]  Doing well  D/C home, f/u 6 wks.    Princess Bruins MD

## 2011-11-06 ENCOUNTER — Encounter: Payer: Self-pay | Admitting: Gastroenterology

## 2011-11-09 ENCOUNTER — Ambulatory Visit (INDEPENDENT_AMBULATORY_CARE_PROVIDER_SITE_OTHER): Payer: BC Managed Care – PPO | Admitting: Gastroenterology

## 2011-11-09 ENCOUNTER — Encounter: Payer: Self-pay | Admitting: Gastroenterology

## 2011-11-09 VITALS — BP 110/64 | HR 80 | Ht 67.0 in | Wt 147.0 lb

## 2011-11-09 DIAGNOSIS — K5289 Other specified noninfective gastroenteritis and colitis: Secondary | ICD-10-CM

## 2011-11-09 MED ORDER — MESALAMINE 1.2 G PO TBEC: 2.4000 g | DELAYED_RELEASE_TABLET | Freq: Every day | ORAL | Status: DC

## 2011-11-09 MED ORDER — HYOSCYAMINE SULFATE ER 0.375 MG PO TBCR: EXTENDED_RELEASE_TABLET | ORAL | Status: DC

## 2011-11-09 MED ORDER — PREDNISONE 1 MG PO TABS: ORAL_TABLET | ORAL | Status: DC

## 2011-11-09 NOTE — Progress Notes (Signed)
History of Present Illness:  Barbara Hall is a pleasant 31 year old white female with history of unspecified colitis referred at the request of Dr. Alroy Dust for evaluation of bleeding and diarrhea. She was diagnosed with colitis in 2007. Records are not available. 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 She's had about 3 flares over the last 5 years consisting of frequent diarrhea with bleeding and mucus. She is on no medications. Approximately 2 weeks ago she developed crampy lower abdominal pain accompanied by frequent loose stools mixed with blood and mucus. She's been treated with asacol and Rowasa suppositories in the past.    Past Medical History  Diagnosis Date  . Postpartum examination following vaginal delivery (11/9) 09/09/2011  . Hemorrhoids   . Mastitis    Past Surgical History  Procedure Date  . Bartholin gland cyst excision 2006   family history includes Colon cancer in her maternal grandmother and Colon polyps in her mother. No current outpatient prescriptions on file.   Allergies as of 11/09/2011  . (No Known Allergies)    reports that she has quit smoking. She has never used smokeless tobacco. She reports that she does not drink alcohol or use illicit drugs.     Review of Systems: Pertinent positive and negative review of systems were noted in the above HPI section. All other review of systems were otherwise negative.  Vital signs were reviewed in today's medical record Physical Exam: General: Well developed , well nourished, no acute distress Head: Normocephalic and atraumatic Eyes:  sclerae anicteric, EOMI Ears: Normal auditory acuity Mouth: No deformity or lesions Neck: Supple, no masses or thyromegaly Lungs: Clear throughout to auscultation Heart: Regular rate and rhythm; no murmurs, rubs or bruits Abdomen: Soft, non tender and non distended. No masses, hepatosplenomegaly or hernias noted. Normal Bowel sounds Rectal:deferred Musculoskeletal:  Symmetrical with no gross deformities  Skin: No lesions on visible extremities Pulses:  Normal pulses noted Extremities: No clubbing, cyanosis, edema or deformities noted Neurological: Alert oriented x 4, grossly nonfocal Cervical Nodes:  No significant cervical adenopathy Inguinal Nodes: No significant inguinal adenopathy Psychological:  Alert and cooperative. Normal mood and affect

## 2011-11-09 NOTE — Patient Instructions (Signed)
You will need to follow up in 1 week We will obtain records for Dr Kelby Fam review from Dr Michail Sermon and Dr Earlean Shawl We will send in your prescriptions to your pharmacy today

## 2011-11-09 NOTE — Assessment & Plan Note (Addendum)
Patient is having a flareup of her colitis. I am unsure whether she has a left-sided colitis or pancolitis. I suspect the former on the basis that topical therapy was used in the past. She has stated that she was slow to respond to asacol in the past  Recommendations #1 begin lialda 2.4 g a day #2 begin prednisone 20 mg daily; patient was instructed to call back in 5 days to report her progress #3 hyomax when necessary #4 review prior records

## 2011-11-14 ENCOUNTER — Telehealth: Payer: Self-pay | Admitting: Gastroenterology

## 2011-11-14 NOTE — Telephone Encounter (Signed)
ok 

## 2011-11-14 NOTE — Telephone Encounter (Signed)
Pt called and states that she is feeling much better. She will keep her ov appt for Friday.

## 2011-11-17 ENCOUNTER — Ambulatory Visit (INDEPENDENT_AMBULATORY_CARE_PROVIDER_SITE_OTHER): Payer: BC Managed Care – PPO | Admitting: Gastroenterology

## 2011-11-17 ENCOUNTER — Encounter: Payer: Self-pay | Admitting: Gastroenterology

## 2011-11-17 VITALS — BP 100/60 | HR 64 | Ht 67.0 in | Wt 144.0 lb

## 2011-11-17 DIAGNOSIS — K5289 Other specified noninfective gastroenteritis and colitis: Secondary | ICD-10-CM

## 2011-11-17 MED ORDER — PREDNISONE (PAK) 10 MG PO TABS: ORAL_TABLET | ORAL | Status: DC

## 2011-11-17 NOTE — Progress Notes (Signed)
History of Present Illness:  Barbara Hall continues to have diarrhea with bleeding. Abdominal pain is decreased. Although she was supposed to be on 20 mg of prednisone she was taking only 4 mg. She remains on lialda.    Review of Systems: Pertinent positive and negative review of systems were noted in the above HPI section. All other review of systems were otherwise negative.    Current Medications, Allergies, Past Medical History, Past Surgical History, Family History and Social History were reviewed in [place] record  Vital signs were reviewed in today's medical record. Physical Exam: General: Well developed , well nourished, no acute distress    2

## 2011-11-17 NOTE — Assessment & Plan Note (Addendum)
She continues to be symptomatic.  Recommendations #1 continue lialda #2 begin prednisone 20 mg daily. She was instructed to call back in 5 days.

## 2011-11-17 NOTE — Patient Instructions (Signed)
Please call back for a follow appointment in 3 weeks

## 2011-11-21 ENCOUNTER — Telehealth: Payer: Self-pay

## 2011-11-21 NOTE — Telephone Encounter (Signed)
Increase to 39m qd, c/b Friday

## 2011-11-21 NOTE — Telephone Encounter (Signed)
Pt states she was instructed to call back and give an update of her condition. Pt states she has been taking prednisone 37m/day for 5 days. Pt reports that she is still having diarrhea. She had 6 diarrhea stools in 3 hours last night. Dr. KDeatra Inaplease advise.

## 2011-11-21 NOTE — Telephone Encounter (Signed)
Pt aware.

## 2011-11-27 ENCOUNTER — Telehealth: Payer: Self-pay | Admitting: Gastroenterology

## 2011-11-27 DIAGNOSIS — R197 Diarrhea, unspecified: Secondary | ICD-10-CM

## 2011-11-27 MED ORDER — PREDNISONE 20 MG PO TABS: 40.0000 mg | ORAL_TABLET | Freq: Every day | ORAL | Status: AC

## 2011-11-27 NOTE — Telephone Encounter (Signed)
Pt scheduled for previsit for 11/28/11@4pm , Flex-sig scheduled for 11/29/11@11am . Pt aware of appt dates and times. Rx sent to the pharmacy, lab order entered.

## 2011-11-27 NOTE — Telephone Encounter (Signed)
R/n prednisone, continue other meds She needs sigmoidoscopy ASAP Please get stool for C. dificile

## 2011-11-27 NOTE — Telephone Encounter (Signed)
Pt was increased to prednisone 38m daily last week. She is out of prednisone now. She is still taking her lialda. Pt states that she is still running to the bathroom every time she eats. She states that her stool is getting a little bit firmer but at times she is still seeing some blood and mucous present. Pt wants to know what she should do with her medications. Please advise.

## 2011-11-28 ENCOUNTER — Ambulatory Visit (AMBULATORY_SURGERY_CENTER): Payer: BC Managed Care – PPO | Admitting: *Deleted

## 2011-11-28 VITALS — Ht 67.0 in | Wt 147.0 lb

## 2011-11-28 DIAGNOSIS — K5289 Other specified noninfective gastroenteritis and colitis: Secondary | ICD-10-CM

## 2011-11-29 ENCOUNTER — Encounter: Payer: Self-pay | Admitting: Gastroenterology

## 2011-11-29 ENCOUNTER — Ambulatory Visit (AMBULATORY_SURGERY_CENTER): Payer: BC Managed Care – PPO | Admitting: Gastroenterology

## 2011-11-29 VITALS — BP 116/72 | HR 77 | Temp 96.6°F | Resp 18 | Ht 67.0 in | Wt 147.0 lb

## 2011-11-29 DIAGNOSIS — K5289 Other specified noninfective gastroenteritis and colitis: Secondary | ICD-10-CM

## 2011-11-29 DIAGNOSIS — K5100 Ulcerative (chronic) pancolitis without complications: Secondary | ICD-10-CM

## 2011-11-29 MED ORDER — MESALAMINE 1.2 G PO TBEC: 4.8000 g | DELAYED_RELEASE_TABLET | Freq: Every day | ORAL | Status: DC

## 2011-11-29 MED ORDER — SODIUM CHLORIDE 0.9 % IV SOLN: 500.0000 mL | INTRAVENOUS | Status: DC

## 2011-11-29 NOTE — Op Note (Signed)
[facility] [address] [place], [place]  [id number]  FLEXIBLE SIGMOIDOSCOPY PROCEDURE REPORT  PATIENT:  Barbara, Hall  MR#:  [id number] BIRTHDATE:  1981/03/29, 30 yrs. old  GENDER:  female  ENDOSCOPIST:  Sandy Salaam. Deatra Ina, MD Referred by:  Donnie Coffin, M.D.  PROCEDURE DATE:  11/29/2011 PROCEDURE:  Flexible Sigmoidoscopy with biopsy ASA CLASS:  Class II INDICATIONS:  diarrhea, ulcerative colitis  MEDICATIONS:   MAC sedation, administered by CRNA propofol 252mIV  DESCRIPTION OF PROCEDURE:   After the risks benefits and alternatives of the procedure were thoroughly explained, informed consent was obtained.  Digital rectal exam was performed and revealed no abnormalities.   The [id number] was introduced through the anus and advanced to the descending colon, without limitations.  The quality of the prep was .  The instrument was then slowly withdrawn as the mucosa was fully examined. <<PROCEDUREIMAGES>>  Colitis was found. Diffusely inflamed, erythematous mucosa with excess mucus, mucosal friability, areas of hemorrhage and superficial ulcers. Bxs taken (see image1, image2, image3, image5, image7, and image8).   Retroflexed views in the rectum revealed ulceration.    The scope was then withdrawn from the patient and the procedure terminated.  COMPLICATIONS:  None  ENDOSCOPIC IMPRESSION: 1) Moderately Active Pancolitis RECOMMENDATIONS: 1) continue current meds 2) Call the office to schedule a followup office visit for 1 week  REPEAT EXAM:  No  ______________________________ RSandy Salaam KDeatra Ina MD  CC:  n. eSIGNED:   RSandy Salaam Jamile Rekowski at 11/29/2011 11:30 AM  SMilagros Loll [phone]

## 2011-11-29 NOTE — Progress Notes (Signed)
Patient did not experience any of the following events: a burn prior to discharge; a fall within the facility; wrong site/side/patient/procedure/implant event; or a hospital transfer or hospital admission upon discharge from the facility. (G8907) Patient did not have preoperative order for IV antibiotic SSI prophylaxis. (G8918)  

## 2011-11-29 NOTE — Patient Instructions (Addendum)
Low fiber, lactose-free diet. Call office in 5 days for followup. Continue Current Meds   Please follow all discharge instructions given to you by the recovery room nurse. If you have any questions or problems after discharge please call [phone]. You will receive a phone call in the am to see how you are doing and answer any questions you may have. Thank you for choosing [facility] for your health care needs.

## 2011-11-29 NOTE — Progress Notes (Signed)
Propofol was administered by Tilman Neat, CRNA. Maw  The pt tolerated the flex sigmoidoscopy very well. Maw

## 2011-11-30 ENCOUNTER — Telehealth: Payer: Self-pay | Admitting: *Deleted

## 2011-11-30 NOTE — Telephone Encounter (Signed)
  Follow up Call-  Call back number 11/29/2011  Post procedure Call Back phone  # [phone]  Permission to leave phone message Yes     Patient questions:  Do you have a fever, pain , or abdominal swelling? no Pain Score  0 *  Have you tolerated food without any problems? yes  Have you been able to return to your normal activities? yes  Do you have any questions about your discharge instructions: Diet   no Medications  no Follow up visit  no  Do you have questions or concerns about your Care? no  Actions: * If pain score is 4 or above: No action needed, pain <4.

## 2011-12-04 ENCOUNTER — Encounter: Payer: Self-pay | Admitting: Gastroenterology

## 2011-12-06 ENCOUNTER — Telehealth: Payer: Self-pay | Admitting: Gastroenterology

## 2011-12-07 NOTE — Telephone Encounter (Signed)
Left message for pt to call back  °

## 2011-12-11 NOTE — Telephone Encounter (Signed)
Left message for pt to call back  °

## 2011-12-12 ENCOUNTER — Encounter: Payer: Self-pay | Admitting: Gastroenterology

## 2011-12-12 ENCOUNTER — Ambulatory Visit (INDEPENDENT_AMBULATORY_CARE_PROVIDER_SITE_OTHER): Payer: BC Managed Care – PPO | Admitting: Gastroenterology

## 2011-12-12 ENCOUNTER — Other Ambulatory Visit: Payer: Self-pay | Admitting: Gastroenterology

## 2011-12-12 DIAGNOSIS — K5150 Left sided colitis without complications: Secondary | ICD-10-CM | POA: Insufficient documentation

## 2011-12-12 DIAGNOSIS — K5289 Other specified noninfective gastroenteritis and colitis: Secondary | ICD-10-CM

## 2011-12-12 DIAGNOSIS — K5190 Ulcerative colitis, unspecified, without complications: Secondary | ICD-10-CM

## 2011-12-12 MED ORDER — PREDNISONE (PAK) 10 MG PO TABS: ORAL_TABLET | ORAL | Status: DC

## 2011-12-12 MED ORDER — MESALAMINE 1.2 G PO TBEC: 4.8000 g | DELAYED_RELEASE_TABLET | Freq: Every day | ORAL | Status: DC

## 2011-12-12 NOTE — Telephone Encounter (Signed)
Pt came in for OV appt today with Dr. Deatra Ina.

## 2011-12-12 NOTE — Telephone Encounter (Signed)
Called pharmacy and gave script verbally

## 2011-12-12 NOTE — Progress Notes (Signed)
History of Present Illness:  Barbara Hall has returned for followup of colitis. She has pancolitis and finally has responded to 40 mg of prednisone. This is in addition to lialda. She's having solid bowel movements and she is without abdominal pain.    Review of Systems: Pertinent positive and negative review of systems were noted in the above HPI section. All other review of systems were otherwise negative.    Current Medications, Allergies, Past Medical History, Past Surgical History, Family History and Social History were reviewed in Barbara Hall record  Vital signs were reviewed in today's medical record. Physical Exam: General: Well developed , well nourished, no acute distress

## 2011-12-12 NOTE — Assessment & Plan Note (Signed)
She's had a recent flareup which has responded to steroids and mesalamine.  Recommendations #1 continue lialda; begin prednisone taper

## 2011-12-12 NOTE — Patient Instructions (Signed)
Begin prednisone 30 mg daily for 5 days, then Prednisone 20 mg daily for 5 days, then Prednisone 15 mg daily for 5 days, then Prednisone 10 mg daily for 7 days, then  prednisone 10 mg alternating with 5 mg daily for 7 days, then Prednisone 5 mg daily for 7 days, then Prednisone 5 mg every other day for 7 days, and then discontinue

## 2012-01-24 ENCOUNTER — Ambulatory Visit: Payer: BC Managed Care – PPO | Admitting: Gastroenterology

## 2012-01-25 ENCOUNTER — Telehealth: Payer: Self-pay | Admitting: Gastroenterology

## 2012-01-25 NOTE — Telephone Encounter (Signed)
Pt missed her appt and wanted to call and give [place] an update. Pt states she has tapered off of her Prednisone and is feeling fine. She reports that she is symptom free. Pt states she will call us back if she needs an appt.

## 2012-05-20 ENCOUNTER — Telehealth: Payer: Self-pay | Admitting: Gastroenterology

## 2012-05-20 NOTE — Telephone Encounter (Signed)
Yes, it's an option if she prefers

## 2012-05-20 NOTE — Telephone Encounter (Signed)
Pt called and states that the Lialda is very expensive and she wonders if Delzicol would be less expensive. Pt wants to know if this is an option for her. Please advise.

## 2012-05-20 NOTE — Telephone Encounter (Signed)
What dosage of Delzicol would you like to prescribe?

## 2012-05-21 MED ORDER — MESALAMINE 400 MG PO CPDR: 800.0000 mg | DELAYED_RELEASE_CAPSULE | Freq: Three times a day (TID) | ORAL | Status: DC

## 2012-05-21 NOTE — Telephone Encounter (Signed)
Spoke with pt and rx sent to pharmacy.

## 2012-05-21 NOTE — Telephone Encounter (Signed)
89m tid

## 2013-12-11 ENCOUNTER — Ambulatory Visit (INDEPENDENT_AMBULATORY_CARE_PROVIDER_SITE_OTHER): Payer: No Typology Code available for payment source | Admitting: Physician Assistant

## 2013-12-11 ENCOUNTER — Encounter: Payer: Self-pay | Admitting: Physician Assistant

## 2013-12-11 VITALS — BP 100/60 | HR 66 | Ht 67.0 in | Wt 152.6 lb

## 2013-12-11 DIAGNOSIS — K5100 Ulcerative (chronic) pancolitis without complications: Secondary | ICD-10-CM

## 2013-12-11 MED ORDER — MESALAMINE 1.2 G PO TBEC: 1.2000 g | DELAYED_RELEASE_TABLET | Freq: Every day | ORAL | Status: DC

## 2013-12-11 MED ORDER — HYOSCYAMINE SULFATE ER 0.375 MG PO TBCR: 1.0000 | EXTENDED_RELEASE_TABLET | Freq: Two times a day (BID) | ORAL | Status: DC

## 2013-12-11 MED ORDER — MESALAMINE 1000 MG RE SUPP: 1000.0000 mg | Freq: Every day | RECTAL | Status: DC

## 2013-12-11 NOTE — Progress Notes (Signed)
Reviewed and agree with management.  If no better in 2 weeks check stool pathogen panel Sandy Salaam. Deatra Ina, M.D., [facility]

## 2013-12-11 NOTE — Progress Notes (Signed)
   Subjective:    Patient ID: Barbara Hall, female    DOB: 08-31-1981, 33 y.o.   MRN: [id number]  HPI Katrenia is a pleasant 33 year old white female with history of universal ulcerative colitis. She has been followed by Dr. Deatra Ina. She last had sigmoidoscopy in January of 2013 and was noted to have moderately active colitis. She's not been in the office since February 2013 and says that she stopped taking her Lialda about a year ago because she had not had any symptoms for several months. Now over the past 2 months she has had recurrence of intermittent rectal bleeding now progressing to diarrhea with 4-5 bowel movements per day and lower abdominal  cramping. She says she  Is seeing blood with every bowel movement and sometimes only  passing blood with mucus. She has not had any fever or chills. No significant fatigue no arthralgias. She's not been on any recent antibiotics.    Review of Systems  Constitutional: Negative.   HENT: Negative.   Eyes: Negative.   Respiratory: Negative.   Cardiovascular: Negative.   Gastrointestinal: Positive for abdominal pain, diarrhea and blood in stool.  Endocrine: Negative.   Genitourinary: Negative.   Musculoskeletal: Negative.   Allergic/Immunologic: Negative.   Neurological: Negative.   Hematological: Negative.   Psychiatric/Behavioral: Negative.    Outpatient Prescriptions Prior to Visit  Medication Sig Dispense Refill  . Hyoscyamine Sulfate (HYOMAX-DT) 0.375 MG TBCR Take one tab twice a day as needed for abdominal pain  60 each  2  . Mesalamine (ASACOL) 400 MG CPDR Take 2 capsules (800 mg total) by mouth 3 (three) times daily.  180 capsule  2  . mesalamine (LIALDA) 1.2 G EC tablet Take 4 tablets (4.8 g total) by mouth daily with breakfast.  120 tablet  5   No facility-administered medications prior to visit.      No Known Allergies   Patient Active Problem List   Diagnosis Date Noted  . Ulcerative colitis 12/12/2011  . Postpartum  examination following vaginal delivery (11/9) 09/09/2011   History  Substance Use Topics  . Smoking status: Never Smoker   . Smokeless tobacco: Never Used  . Alcohol Use: No   family history includes Colon cancer in her maternal grandmother; Colon polyps in her mother.  Objective:   Physical Exam  well-developed young white female in no acute distress, pleasant blood pressure 100/60 pulse 66 height 5 foot 7 weight 152. HEENT; nontraumatic normocephalic EOMI PERRLA sclera anicteric, Supple; no JVD, Cardiovascular regular rate and rhythm with S1-S2 no murmur or gallop, Pulmonary; clear bilaterally, Abdomen; soft mildly tender in the left mid quadrant left lower quadrant no guarding or rebound no palpable mass or hepatomegaly bowel sounds are present, Rectal; exam not done, Extremities; no clubbing cyanosis or edema skin warm and dry, Psych; mood and affect normal and appropriate        Assessment & Plan:  #37  32 year old female with known universal ulcerative colitis which had been relatively quiet since over the past 2 years. Patient off medication x1 year now with recurrent symptoms x2 months with somewhat progressive lower abdominal cramping diarrhea and rectal bleeding.  Plan; restart Lialda 4.8 g once daily Start Hyomax once twice daily as needed for cramping and spasm Start Canasa suppositories at bedtime daily x10-14 days Patient is asked to call back if her symptoms have not significantly improved in the next 2-3 weeks, at that point would consider a course of Ulceris.

## 2013-12-11 NOTE — Patient Instructions (Signed)
We sent prescriptions to [facility]. 1. Lialda 1.2 G 2. Canasa Suppositories 3. Hyomax Antispasmodic   Please call us if your symtoms worsen.

## 2014-08-31 ENCOUNTER — Encounter: Payer: Self-pay | Admitting: Physician Assistant

## 2015-09-02 ENCOUNTER — Other Ambulatory Visit: Payer: Self-pay | Admitting: Physician Assistant

## 2015-09-02 ENCOUNTER — Other Ambulatory Visit: Payer: Self-pay | Admitting: Emergency Medicine

## 2015-09-02 NOTE — Telephone Encounter (Signed)
Rx refilled for Canasa suppositories. Pt has an appt with [facility] 10/2015.

## 2015-11-05 ENCOUNTER — Encounter: Payer: Self-pay | Admitting: Gastroenterology

## 2015-11-05 ENCOUNTER — Other Ambulatory Visit (INDEPENDENT_AMBULATORY_CARE_PROVIDER_SITE_OTHER): Payer: BLUE CROSS/BLUE SHIELD

## 2015-11-05 ENCOUNTER — Ambulatory Visit (INDEPENDENT_AMBULATORY_CARE_PROVIDER_SITE_OTHER): Payer: BLUE CROSS/BLUE SHIELD | Admitting: Gastroenterology

## 2015-11-05 VITALS — BP 90/60 | HR 77 | Ht 67.0 in | Wt 153.0 lb

## 2015-11-05 DIAGNOSIS — K51519 Left sided colitis with unspecified complications: Secondary | ICD-10-CM | POA: Diagnosis not present

## 2015-11-05 LAB — CBC WITH DIFFERENTIAL/PLATELET
Basophils Absolute: 0 10*3/uL (ref 0.0–0.1)
Basophils Relative: 0.6 % (ref 0.0–3.0)
Eosinophils Absolute: 0.1 10*3/uL (ref 0.0–0.7)
Eosinophils Relative: 2.1 % (ref 0.0–5.0)
HCT: 40.4 % (ref 36.0–46.0)
Hemoglobin: 13.2 g/dL (ref 12.0–15.0)
Lymphocytes Relative: 37.3 % (ref 12.0–46.0)
Lymphs Abs: 2.3 10*3/uL (ref 0.7–4.0)
MCHC: 32.6 g/dL (ref 30.0–36.0)
MCV: 86.1 fl (ref 78.0–100.0)
Monocytes Absolute: 0.7 10*3/uL (ref 0.1–1.0)
Monocytes Relative: 11.6 % (ref 3.0–12.0)
Neutro Abs: 3 10*3/uL (ref 1.4–7.7)
Neutrophils Relative %: 48.4 % (ref 43.0–77.0)
Platelets: 286 10*3/uL (ref 150.0–400.0)
RBC: 4.69 Mil/uL (ref 3.87–5.11)
RDW: 13.2 % (ref 11.5–15.5)
WBC: 6.1 10*3/uL (ref 4.0–10.5)

## 2015-11-05 LAB — COMPREHENSIVE METABOLIC PANEL
ALT: 10 U/L (ref 0–35)
AST: 12 U/L (ref 0–37)
Albumin: 4.3 g/dL (ref 3.5–5.2)
Alkaline Phosphatase: 43 U/L (ref 39–117)
BUN: 16 mg/dL (ref 6–23)
CO2: 28 mEq/L (ref 19–32)
Calcium: 9.7 mg/dL (ref 8.4–10.5)
Chloride: 104 mEq/L (ref 96–112)
Creatinine, Ser: 0.66 mg/dL (ref 0.40–1.20)
GFR: 108.18 mL/min (ref >60.00)
Glucose, Bld: 85 mg/dL (ref 70–99)
Potassium: 4.8 mEq/L (ref 3.5–5.1)
Sodium: 139 mEq/L (ref 135–145)
Total Bilirubin: 0.6 mg/dL (ref 0.2–1.2)
Total Protein: 7 g/dL (ref 6.0–8.3)

## 2015-11-05 MED ORDER — MESALAMINE 1000 MG RE SUPP: 1000.0000 mg | Freq: Every day | RECTAL | Status: DC

## 2015-11-05 MED ORDER — HYOSCYAMINE SULFATE ER 0.375 MG PO TB12: 0.3750 mg | ORAL_TABLET | Freq: Two times a day (BID) | ORAL | Status: DC

## 2015-11-05 MED ORDER — MESALAMINE 1.2 G PO TBEC: 2.4000 g | DELAYED_RELEASE_TABLET | Freq: Every day | ORAL | Status: DC

## 2015-11-05 NOTE — Progress Notes (Signed)
HPI :  35 y/o female with a history of left sided ulcerative colitis, here for follow up. She is a new patient to me. She thinks diagnosed with UC in 2007. It appears she has had left sided colitis only on 2 colonoscopies, and then flex sig showed left sided as well. Endoscopic history as outlined below.  She is on Lialda on 2 per day at baseline, and then 4 tabs per day if she flares. If she takes her medication is works well for her. She has roughly 1-2 BMs per day, formed stools, no blood in the stools at baseline. If she doesn't do well she will increase to Lialda 4.8 gm and also adds on Canasa. No prior hospitalizations for her UC. She has been on prednisone at time of her diagnosis, and another flare after her son was delivered. She thinks maybe 3 courses of prednisone since diagnosis total. She has never needed escalation of therapy over mesalamine. She thinks at times when she feels well she can forget to take her pills when she is feeling well and has had several periods of times of noncompliance. Most of the time she is compliant.  She does not take any NSAIDs.   She has no FH of Crohns or colitis. On mother's side, grandmother and aunt had colon cancer.   Colonoscopy 2007 - left sided colitis Colonoscopy 2010 - active proctitis only Flex sig 2013 - left sided colitis   Past Medical History  Diagnosis Date  . Postpartum examination following vaginal delivery (11/9) 09/09/2011  . Hemorrhoids   . Mastitis   . Anemia   . Colitis, chronic, ulcerative      Past Surgical History  Procedure Laterality Date  . Bartholin gland cyst excision  2006   Family History  Problem Relation Age of Onset  . Colon cancer Maternal Grandmother     15 yrs ago dx   . Colon polyps Mother    Social History  Substance Use Topics  . Smoking status: Never Smoker   . Smokeless tobacco: Never Used  . Alcohol Use: No   Current Outpatient Prescriptions  Medication Sig Dispense Refill  . CANASA  1000 MG suppository PLACE 1 SUPPOSITORY (1,000 MG TOTAL) RECTALLY AT BEDTIME. 14 suppository 0  . Hyoscyamine Sulfate 0.375 MG TBCR Take 1 tablet (0.375 mg total) by mouth 2 (two) times daily. 60 tablet 6  . mesalamine (LIALDA) 1.2 G EC tablet Take 1 tablet (1.2 g total) by mouth daily with breakfast. 120 tablet 11  . Tetrahydrozoline HCl (EYE DROPS OP) Apply to eye. For dry eye:  XIIDRA 7     No current facility-administered medications for this visit.   No Known Allergies   Review of Systems: All systems reviewed and negative except where noted in HPI.   Lab Results  Component Value Date   WBC 16.2* 09/09/2011   HGB 9.4* 09/09/2011   HCT 30.2* 09/09/2011   MCV 77.4* 09/09/2011   PLT 204 09/09/2011    No results found for: CREATININE, BUN, NA, K, CL, CO2  No results found for: ALT, AST, GGT, ALKPHOS, BILITOT   Physical Exam: Ht 5' 7"  (1.702 m)  Wt 153 lb (69.4 kg)  BMI 23.96 kg/m2 Constitutional: Pleasant,well-developed, female in no acute distress. HEENT: Normocephalic and atraumatic. Conjunctivae are normal. No scleral icterus. Neck supple.  Cardiovascular: Normal rate, regular rhythm.  Pulmonary/chest: Effort normal and breath sounds normal. No wheezing, rales or rhonchi. Abdominal: Soft, nondistended, nontender. Bowel sounds active throughout.  There are no masses palpable. No hepatomegaly. Extremities: no edema Lymphadenopathy: No cervical adenopathy noted. Neurological: Alert and oriented to person place and time. Skin: Skin is warm and dry. No rashes noted. Psychiatric: Normal mood and affect. Behavior is normal.   ASSESSMENT AND PLAN: 35 y/o female with history of presumed left sided ulcerative colitis based on prior endoscopic evaluation, who has been maintained primarily on Lialda monotherapy for the past several years and generally does okay without severe flares, however she has had periods of time where she stopped her medication since she feels well on it. I  discussed UC with her in general, long term risks including increased risk for colon cancer, and management strategies. Given she does well on Lialda if she is compliant with it, will continue it at 2.4gm per day and emphasized compliance. She is due for baseline labs and needs renal function checked yearly while on this regimen. Will send her for CBC and CMP today. Otherwise, she is avoiding NSAIDs. Once she hits the 15 year mark from her diagnosis, will recommend surveillance colonoscopies. Counseled patient to call PRN if she is not doing well, otherwise follow up at least yearly for this issue. Refilled Canasa to use PRN and hyoscyamine as well. She agreed. All questions answered.   [place] Cellar, MD [facility] Gastroenterology Pager [phone]

## 2015-11-05 NOTE — Patient Instructions (Signed)
Your physician has requested that you go to the basement for lab work before leaving today.  We have sent medications to your pharmacy for you to pick up at your convenience.  Follow up in one year.

## 2015-11-09 ENCOUNTER — Other Ambulatory Visit: Payer: Self-pay | Admitting: *Deleted

## 2015-11-09 DIAGNOSIS — K5190 Ulcerative colitis, unspecified, without complications: Secondary | ICD-10-CM

## 2015-11-16 ENCOUNTER — Telehealth: Payer: Self-pay | Admitting: Gastroenterology

## 2015-11-17 NOTE — Telephone Encounter (Signed)
Is there an alternative for this pt? Please advise.

## 2015-11-17 NOTE — Telephone Encounter (Signed)
We can try Bentyl (dicyclomine), hopefully this may be cheaper for her. Thanks

## 2015-11-19 MED ORDER — DICYCLOMINE HCL 20 MG PO TABS: 20.0000 mg | ORAL_TABLET | Freq: Four times a day (QID) | ORAL | Status: DC | PRN

## 2015-11-19 NOTE — Telephone Encounter (Signed)
I left a detailed message for the patient that rx was sent to the pharmacy and apologized  for the delay in returning her call

## 2016-02-25 DIAGNOSIS — J018 Other acute sinusitis: Secondary | ICD-10-CM | POA: Diagnosis not present

## 2016-10-03 ENCOUNTER — Encounter: Payer: Self-pay | Admitting: Gastroenterology

## 2016-11-03 ENCOUNTER — Telehealth: Payer: Self-pay

## 2016-11-03 ENCOUNTER — Other Ambulatory Visit: Payer: Self-pay

## 2016-11-03 NOTE — Telephone Encounter (Signed)
Left message for patient to come by our lab to get yearly labwork done.

## 2016-11-10 ENCOUNTER — Other Ambulatory Visit (INDEPENDENT_AMBULATORY_CARE_PROVIDER_SITE_OTHER): Payer: BLUE CROSS/BLUE SHIELD

## 2016-11-10 ENCOUNTER — Other Ambulatory Visit: Payer: Self-pay

## 2016-11-10 DIAGNOSIS — Z01419 Encounter for gynecological examination (general) (routine) without abnormal findings: Secondary | ICD-10-CM | POA: Diagnosis not present

## 2016-11-10 DIAGNOSIS — K5150 Left sided colitis without complications: Secondary | ICD-10-CM | POA: Diagnosis not present

## 2016-11-10 DIAGNOSIS — Z6822 Body mass index (BMI) 22.0-22.9, adult: Secondary | ICD-10-CM | POA: Diagnosis not present

## 2016-11-10 DIAGNOSIS — Z1151 Encounter for screening for human papillomavirus (HPV): Secondary | ICD-10-CM | POA: Diagnosis not present

## 2016-11-10 DIAGNOSIS — N854 Malposition of uterus: Secondary | ICD-10-CM | POA: Diagnosis not present

## 2016-11-10 DIAGNOSIS — Z30432 Encounter for removal of intrauterine contraceptive device: Secondary | ICD-10-CM | POA: Diagnosis not present

## 2016-11-10 LAB — CBC WITH DIFFERENTIAL/PLATELET
Basophils Absolute: 0 10*3/uL (ref 0.0–0.1)
Basophils Relative: 0.5 % (ref 0.0–3.0)
Eosinophils Absolute: 0.1 10*3/uL (ref 0.0–0.7)
Eosinophils Relative: 1.6 % (ref 0.0–5.0)
HCT: 37.9 % (ref 36.0–46.0)
Hemoglobin: 12.6 g/dL (ref 12.0–15.0)
Lymphocytes Relative: 41.4 % (ref 12.0–46.0)
Lymphs Abs: 3.3 10*3/uL (ref 0.7–4.0)
MCHC: 33.2 g/dL (ref 30.0–36.0)
MCV: 84.7 fl (ref 78.0–100.0)
Monocytes Absolute: 0.6 10*3/uL (ref 0.1–1.0)
Monocytes Relative: 8.1 % (ref 3.0–12.0)
Neutro Abs: 3.8 10*3/uL (ref 1.4–7.7)
Neutrophils Relative %: 48.4 % (ref 43.0–77.0)
Platelets: 318 10*3/uL (ref 150.0–400.0)
RBC: 4.48 Mil/uL (ref 3.87–5.11)
RDW: 13.5 % (ref 11.5–15.5)
WBC: 7.9 10*3/uL (ref 4.0–10.5)

## 2016-11-10 LAB — COMPREHENSIVE METABOLIC PANEL
ALT: 11 U/L (ref 0–35)
AST: 11 U/L (ref 0–37)
Albumin: 4.3 g/dL (ref 3.5–5.2)
Alkaline Phosphatase: 42 U/L (ref 39–117)
BUN: 17 mg/dL (ref 6–23)
CO2: 30 mEq/L (ref 19–32)
Calcium: 9.4 mg/dL (ref 8.4–10.5)
Chloride: 104 mEq/L (ref 96–112)
Creatinine, Ser: 0.61 mg/dL (ref 0.40–1.20)
GFR: 117.8 mL/min (ref >60.00)
Glucose, Bld: 93 mg/dL (ref 70–99)
Potassium: 4.1 mEq/L (ref 3.5–5.1)
Sodium: 138 mEq/L (ref 135–145)
Total Bilirubin: 0.4 mg/dL (ref 0.2–1.2)
Total Protein: 7 g/dL (ref 6.0–8.3)

## 2016-11-28 ENCOUNTER — Ambulatory Visit (INDEPENDENT_AMBULATORY_CARE_PROVIDER_SITE_OTHER): Payer: BLUE CROSS/BLUE SHIELD | Admitting: Gastroenterology

## 2016-11-28 ENCOUNTER — Encounter: Payer: Self-pay | Admitting: Gastroenterology

## 2016-11-28 ENCOUNTER — Encounter (INDEPENDENT_AMBULATORY_CARE_PROVIDER_SITE_OTHER): Payer: Self-pay

## 2016-11-28 VITALS — BP 106/56 | HR 75 | Ht 67.0 in | Wt 152.4 lb

## 2016-11-28 DIAGNOSIS — K5150 Left sided colitis without complications: Secondary | ICD-10-CM

## 2016-11-28 MED ORDER — MESALAMINE 1.2 G PO TBEC: 1.2000 g | DELAYED_RELEASE_TABLET | Freq: Two times a day (BID) | ORAL | 3 refills | Status: DC

## 2016-11-28 NOTE — Patient Instructions (Signed)
If you are age 37 or older, your body mass index should be between 23-30. Your Body mass index is 23.87 kg/m. If this is out of the aforementioned range listed, please consider follow up with your Primary Care Provider.  If you are age 63 or younger, your body mass index should be between 19-25. Your Body mass index is 23.87 kg/m. If this is out of the aformentioned range listed, please consider follow up with your Primary Care Provider.   We have sent the following medications to your pharmacy for you to pick up at your convenience:  [facility] has requested that you go to the basement for the following lab work before leaving today:  Vitamin D  Please follow up with Dr Havery Moros in 1 year. You will be contacted when it is time to schedule.  Thank you.

## 2016-11-28 NOTE — Progress Notes (Signed)
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 It appears she has had left sided colitis only on 2 colonoscopies, and then flex sig showed left sided as well. Endoscopic history as outlined below. Treated with mesalamine over the years which works well for her but compliance has been an issue. No hospitalizations since diagnosis. Endoscopies have been done at times of flaring and not when feeling well.   She has no FH of Crohns or colitis. On mother's side, grandmother and aunt had colon cancer.   Colonoscopy 2007 - left sided colitis Colonoscopy 2010 - active proctitis only Flex sig 2013 - left sided colitis  SINCE LAST VISIT:  36 year old female here for follow-up.  I have not seen her in the past year. She is having about 1-2 BMs per day. No blood in the stools. She reports she has had some issues with compliance. She tends to stop Lialda when she feels well. If she takes it twice per day she does well. She has used the Canasa PRN. No flares in the past year. She denies any NSAID use. Weight is stable. Eating well without complaints.    Past Medical History:  Diagnosis Date  . Anemia   . Colitis, chronic, ulcerative ([place])   . Hemorrhoids   . Mastitis   . Postpartum examination following vaginal delivery (11/9) 09/09/2011     Past Surgical History:  Procedure Laterality Date  . BARTHOLIN GLAND CYST EXCISION  2006   Family History  Problem Relation Age of Onset  . Colon cancer Maternal Grandmother     15 yrs ago dx   . Colon polyps Mother    Social History  Substance Use Topics  . Smoking status: Never Smoker  . Smokeless tobacco: Never Used  . Alcohol use No   Current Outpatient Prescriptions  Medication Sig Dispense Refill  . dicyclomine (BENTYL) 20 MG tablet Take 1 tablet (20 mg total) by mouth 4 (four) times daily as needed for spasms. 120 tablet 1  . mesalamine (CANASA) 1000 MG suppository Place 1 suppository (1,000 mg total)  rectally at bedtime. 30 suppository 12  . mesalamine (LIALDA) 1.2 g EC tablet Take 1 tablet (1.2 g total) by mouth 2 (two) times daily. 180 tablet 3  . TRI-PREVIFEM 0.18/0.215/0.25 MG-35 MCG tablet Take 1 tablet by mouth daily.  12   No current facility-administered medications for this visit.    No Known Allergies   Review of Systems: All systems reviewed and negative except where noted in HPI.   Lab Results  Component Value Date   WBC 7.9 11/10/2016   HGB 12.6 11/10/2016   HCT 37.9 11/10/2016   MCV 84.7 11/10/2016   PLT 318.0 11/10/2016    Lab Results  Component Value Date   ALT 11 11/10/2016   AST 11 11/10/2016   ALKPHOS 42 11/10/2016   BILITOT 0.4 11/10/2016    Lab Results  Component Value Date   CREATININE 0.61 11/10/2016   BUN 17 11/10/2016   NA 138 11/10/2016   K 4.1 11/10/2016   CL 104 11/10/2016   CO2 30 11/10/2016     Physical Exam: BP (!) 106/56   Pulse 75   Ht 5' 7"  (1.702 m)   Wt 152 lb 6.4 oz (69.1 kg)   LMP 11/26/2016 (Approximate)   BMI 23.87 kg/m  Constitutional: Pleasant,well-developed, female in no acute distress. HEENT: Normocephalic and atraumatic. Conjunctivae are normal. No scleral icterus. Neck supple.  Cardiovascular: Normal rate,  regular rhythm.  Pulmonary/chest: Effort normal and breath sounds normal. No wheezing, rales or rhonchi. Abdominal: Soft, nondistended, nontender.  There are no masses palpable. No hepatomegaly. Extremities: no edema Lymphadenopathy: No cervical adenopathy noted. Neurological: Alert and oriented to person place and time. Skin: Skin is warm and dry. No rashes noted. Psychiatric: Normal mood and affect. Behavior is normal.   ASSESSMENT AND PLAN: 36 year old female here for reassessment for left-sided ulcerative colitis.  In general since she has mild disease and has been doing well over the years without hospitalizations or significant flares. She does have problems with consistent use of Lialda,  admitting to stopping her medicines when she feels well and taking them as needed for flares. I advised her that she is in need of maintenance medication with compliance to minimize her risk for flaring and help keep her in remission to minimize her long-term risk of colon cancer. We had a lengthy discussion about this and she verbalized understanding and is going to try to be as compliant as possible if Lialda 2 tabs day. Once she gets 15 year mark of her disease she will be in need of surveillance colonoscopy every one to 2 years. Renal function stable, no anemia. Recommend baseline vitamin D level at her next lab check. She should continue to avoid NSAIDs and follow-up with me in one year or sooner with any questions or concerns  [place] Cellar, MD [facility] Gastroenterology Pager [phone]

## 2017-04-04 ENCOUNTER — Other Ambulatory Visit: Payer: Self-pay

## 2017-04-04 ENCOUNTER — Encounter: Payer: Self-pay | Admitting: Gastroenterology

## 2017-04-04 ENCOUNTER — Telehealth: Payer: Self-pay

## 2017-04-04 MED ORDER — MESALAMINE 1.2 G PO TBEC: 1.2000 g | DELAYED_RELEASE_TABLET | Freq: Two times a day (BID) | ORAL | 3 refills | Status: DC

## 2017-04-04 NOTE — Telephone Encounter (Signed)
Pt is requesting refill of her Lialda. Are you ok to refill?

## 2017-04-04 NOTE — Telephone Encounter (Signed)
Lialda refilled. Sent to [facility] on [address]) at pts request. Pt informed through My Chart.

## 2017-04-04 NOTE — Telephone Encounter (Signed)
Yes we can refill it. Please give her a 90 day supply with 3 refills please. Thanks

## 2017-11-20 DIAGNOSIS — Z01419 Encounter for gynecological examination (general) (routine) without abnormal findings: Secondary | ICD-10-CM | POA: Diagnosis not present

## 2017-11-20 DIAGNOSIS — Z6823 Body mass index (BMI) 23.0-23.9, adult: Secondary | ICD-10-CM | POA: Diagnosis not present

## 2018-02-18 ENCOUNTER — Telehealth: Payer: Self-pay | Admitting: Gastroenterology

## 2018-02-18 DIAGNOSIS — K51519 Left sided colitis with unspecified complications: Secondary | ICD-10-CM

## 2018-02-18 MED ORDER — MESALAMINE 1000 MG RE SUPP
1000.0000 mg | Freq: Every day | RECTAL | 0 refills | Status: DC
Start: 2018-02-18 — End: 2018-04-03

## 2018-02-18 NOTE — Telephone Encounter (Signed)
Pt has upcoming appt on 04-03-18. Refilled Canasa for 1 month.

## 2018-04-03 ENCOUNTER — Ambulatory Visit (INDEPENDENT_AMBULATORY_CARE_PROVIDER_SITE_OTHER): Payer: BLUE CROSS/BLUE SHIELD | Admitting: Gastroenterology

## 2018-04-03 ENCOUNTER — Encounter: Payer: Self-pay | Admitting: Gastroenterology

## 2018-04-03 ENCOUNTER — Other Ambulatory Visit (INDEPENDENT_AMBULATORY_CARE_PROVIDER_SITE_OTHER): Payer: BLUE CROSS/BLUE SHIELD

## 2018-04-03 VITALS — BP 100/76 | HR 68 | Ht 67.0 in | Wt 154.0 lb

## 2018-04-03 DIAGNOSIS — K51519 Left sided colitis with unspecified complications: Secondary | ICD-10-CM | POA: Diagnosis not present

## 2018-04-03 DIAGNOSIS — R5383 Other fatigue: Secondary | ICD-10-CM

## 2018-04-03 DIAGNOSIS — K5150 Left sided colitis without complications: Secondary | ICD-10-CM

## 2018-04-03 LAB — CBC WITH DIFFERENTIAL/PLATELET
Basophils Absolute: 0.1 10*3/uL (ref 0.0–0.1)
Basophils Relative: 0.9 % (ref 0.0–3.0)
Eosinophils Absolute: 0.1 10*3/uL (ref 0.0–0.7)
Eosinophils Relative: 1.7 % (ref 0.0–5.0)
HCT: 39.8 % (ref 36.0–46.0)
Hemoglobin: 13.4 g/dL (ref 12.0–15.0)
Lymphocytes Relative: 34.4 % (ref 12.0–46.0)
Lymphs Abs: 2.6 10*3/uL (ref 0.7–4.0)
MCHC: 33.6 g/dL (ref 30.0–36.0)
MCV: 87.1 fl (ref 78.0–100.0)
Monocytes Absolute: 0.7 10*3/uL (ref 0.1–1.0)
Monocytes Relative: 9.2 % (ref 3.0–12.0)
Neutro Abs: 4 10*3/uL (ref 1.4–7.7)
Neutrophils Relative %: 53.8 % (ref 43.0–77.0)
Platelets: 319 10*3/uL (ref 150.0–400.0)
RBC: 4.58 Mil/uL (ref 3.87–5.11)
RDW: 13.1 % (ref 11.5–15.5)
WBC: 7.5 10*3/uL (ref 4.0–10.5)

## 2018-04-03 LAB — COMPREHENSIVE METABOLIC PANEL
ALT: 8 U/L (ref 0–35)
AST: 12 U/L (ref 0–37)
Albumin: 4.2 g/dL (ref 3.5–5.2)
Alkaline Phosphatase: 37 U/L — ABNORMAL LOW (ref 39–117)
BUN: 16 mg/dL (ref 6–23)
CO2: 30 mEq/L (ref 19–32)
Calcium: 9.6 mg/dL (ref 8.4–10.5)
Chloride: 102 mEq/L (ref 96–112)
Creatinine, Ser: 0.67 mg/dL (ref 0.40–1.20)
GFR: 104.9 mL/min (ref >60.00)
Glucose, Bld: 88 mg/dL (ref 70–99)
Potassium: 4.5 mEq/L (ref 3.5–5.1)
Sodium: 137 mEq/L (ref 135–145)
Total Bilirubin: 0.6 mg/dL (ref 0.2–1.2)
Total Protein: 7.2 g/dL (ref 6.0–8.3)

## 2018-04-03 LAB — TSH: TSH: 1.29 u[IU]/mL (ref 0.35–4.50)

## 2018-04-03 LAB — VITAMIN D 25 HYDROXY (VIT D DEFICIENCY, FRACTURES): VITD: 29.63 ng/mL — ABNORMAL LOW (ref 30.00–100.00)

## 2018-04-03 MED ORDER — MESALAMINE 1000 MG RE SUPP: 1000.0000 mg | Freq: Every day | RECTAL | 3 refills | Status: DC | PRN

## 2018-04-03 MED ORDER — MESALAMINE 1.2 G PO TBEC: 1.2000 g | DELAYED_RELEASE_TABLET | Freq: Two times a day (BID) | ORAL | 3 refills | Status: DC

## 2018-04-03 MED ORDER — DICYCLOMINE HCL 20 MG PO TABS: 20.0000 mg | ORAL_TABLET | Freq: Three times a day (TID) | ORAL | 2 refills | Status: DC | PRN

## 2018-04-03 MED ORDER — SUPREP BOWEL PREP KIT 17.5-3.13-1.6 GM/177ML PO SOLN: ORAL | 0 refills | Status: DC

## 2018-04-03 NOTE — Progress Notes (Signed)
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 It appears she has had left sided colitis only on 2 colonoscopies, and then flex sig showed left sided as well. Endoscopic history as outlined below. Treated with mesalamine over the years which works well for her but compliance has been an issue. No hospitalizations since diagnosis. Endoscopies have been done at times of flaring and not when feeling well.   She has no FH of Crohns or colitis. On mother's side, grandmother and aunt had colon cancer.   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 I have not seen her since January 2018. She is prescribed Lialda to take 2 tablets once daily. She states when she is feeling well she has not been taking it regularly. She may take 1 tablet at night at times, sometimes does not take it at all. She has had a flare of her symptoms in recent months, she has used some Canasa as needed which she thinks helps. She denies any abdominal pains that bother her. She does have some periodic blood in her stool. She doesn't have any nausea or vomiting. She thinks her father may have been diagnosed with Crohn's in the past year or so. She is not using any NSAIDs. She has felt more fatigued recently. She has been using Bentyl as needed for rare cramps. She has never been on biologic therapy.    Past Medical History:  Diagnosis Date  . Anemia   . Colitis, chronic, ulcerative ([place])   . Hemorrhoids   . Mastitis   . Postpartum examination following vaginal delivery (11/9) 09/09/2011     Past Surgical History:  Procedure Laterality Date  . BARTHOLIN GLAND CYST EXCISION  2006   Family History  Problem Relation Age of Onset  . Colon cancer Maternal Grandmother        15 yrs ago dx   . Colon polyps Mother    Social History    Tobacco Use  . Smoking status: Never Smoker  . Smokeless tobacco: Never Used  Substance Use Topics  . Alcohol use: No  . Drug use: No   Current Outpatient Medications  Medication Sig Dispense Refill  . dicyclomine (BENTYL) 20 MG tablet Take 1 tablet (20 mg total) by mouth 4 (four) times daily as needed for spasms. 120 tablet 1  . mesalamine (CANASA) 1000 MG suppository Place 1 suppository (1,000 mg total) rectally at bedtime. 30 suppository 0  . mesalamine (LIALDA) 1.2 g EC tablet Take 1 tablet (1.2 g total) by mouth 2 (two) times daily. 180 tablet 3  . TRI-PREVIFEM 0.18/0.215/0.25 MG-35 MCG tablet Take 1 tablet by mouth daily.  12   No current facility-administered medications for this visit.    No Known Allergies   Review of Systems: All systems reviewed and negative except where noted in HPI.   Lab Results  Component Value Date   WBC 7.5 04/03/2018   HGB 13.4 04/03/2018   HCT 39.8 04/03/2018   MCV 87.1 04/03/2018   PLT 319.0 04/03/2018    Lab Results  Component Value Date   CREATININE 0.67 04/03/2018   BUN 16 04/03/2018   NA 137 04/03/2018   K 4.5 04/03/2018   CL 102 04/03/2018   CO2 30 04/03/2018    Lab Results  Component Value Date  ALT 8 04/03/2018   AST 12 04/03/2018   ALKPHOS 37 (L) 04/03/2018   BILITOT 0.6 04/03/2018     Physical Exam: BP 100/76   Pulse 68   Ht 5' 7"  (1.702 m)   Wt 154 lb (69.9 kg)   LMP 03/27/2018 (Approximate)   BMI 24.12 kg/m  Constitutional: Pleasant,well-developed, female in no acute distress. HEENT: Normocephalic and atraumatic. Conjunctivae are normal. No scleral icterus. Neck supple.  Cardiovascular: Normal rate, regular rhythm.  Pulmonary/chest: Effort normal and breath sounds normal. No wheezing, rales or rhonchi. Abdominal: Soft, nondistended, nontender.  There are no masses palpable. No hepatomegaly. Extremities: no edema Lymphadenopathy: No cervical adenopathy noted. Neurological: Alert and oriented to  person place and time. Skin: Skin is warm and dry. No rashes noted. Psychiatric: Normal mood and affect. Behavior is normal.   ASSESSMENT AND PLAN: 37 year old female here for reassessment for left-sided ulcerative colitis.  In general since she has had mild disease and has been doing well over the years without hospitalizations or significant flares. However she has had a hard time taking Lialda consistently when she feels well, and thus having some breakthrough symptoms and small flares periodically. We discussed the goals of therapy are to make her feel well without having flares of symptoms and to minimize her risk for colon cancer in the future. This is best achieved with achieving mucosal remission, but requires maintenance therapy and compliance with medications. Of all the therapies available to treat colitis, Lialda/mesalamine is the safest and cheapest option for her if it works. We discussed trying to maintaining compliance with using Lialda 2 tablets per day even if she is feeling well. She will try to do this. She will continue to use Canasa as needed. Her baseline labs were checked today and shows no anemia and stable renal function which is excellent. We'll check her vitamin D in her thyroid to ensure okay in light of some of her fatigue. I otherwise think performing a colonoscopy in the upcoming months would be useful to ensure she response to mesalamine appropriately when she takes it. She's not had an endoscopic evaluation several years. Following a discussion of risks and benefits of colonoscopy she was agreeable to proceed with this. We will plan on doing this a few months. Otherwise I would like to see her again in 6 months for reassessment.  [place] Cellar, MD [facility]

## 2018-04-03 NOTE — Patient Instructions (Addendum)
If you are age 38 or older, your body mass index should be between 23-30. Your Body mass index is 24.12 kg/m. If this is out of the aforementioned range listed, please consider follow up with your Primary Care Provider.  If you are age 27 or younger, your body mass index should be between 19-25. Your Body mass index is 24.12 kg/m. If this is out of the aformentioned range listed, please consider follow up with your Primary Care Provider.   You have been scheduled for a colonoscopy. Please follow written instructions given to you at your visit today.  Please pick up your prep supplies at the pharmacy within the next 1-3 days. If you use inhalers (even only as needed), please bring them with you on the day of your procedure. Your physician has requested that you go to www.startemmi.com and enter the access code given to you at your visit today. This web site gives a general overview about your procedure. However, you should still follow specific instructions given to you by our office regarding your preparation for the procedure.  Please go to the lab in the basement of our building to have lab work done as you leave today.  We have sent the following medications to your pharmacy for you to pick up at your convenience: [place]  Thank you for entrusting me with your care and for choosing [facility], Dr. [place] Cellar

## 2018-04-05 ENCOUNTER — Other Ambulatory Visit: Payer: Self-pay

## 2018-04-05 MED ORDER — MESALAMINE 1000 MG RE SUPP: 1000.0000 mg | Freq: Every day | RECTAL | 2 refills | Status: DC

## 2018-05-20 DIAGNOSIS — J028 Acute pharyngitis due to other specified organisms: Secondary | ICD-10-CM | POA: Diagnosis not present

## 2018-06-12 ENCOUNTER — Encounter: Payer: Self-pay | Admitting: Gastroenterology

## 2018-06-26 ENCOUNTER — Encounter: Payer: Self-pay | Admitting: Gastroenterology

## 2018-06-26 ENCOUNTER — Ambulatory Visit (AMBULATORY_SURGERY_CENTER): Payer: BLUE CROSS/BLUE SHIELD | Admitting: Gastroenterology

## 2018-06-26 VITALS — BP 104/60 | HR 76 | Temp 99.1°F | Resp 11 | Ht 67.0 in | Wt 154.0 lb

## 2018-06-26 DIAGNOSIS — K5190 Ulcerative colitis, unspecified, without complications: Secondary | ICD-10-CM | POA: Diagnosis not present

## 2018-06-26 DIAGNOSIS — D123 Benign neoplasm of transverse colon: Secondary | ICD-10-CM

## 2018-06-26 DIAGNOSIS — K5150 Left sided colitis without complications: Secondary | ICD-10-CM | POA: Diagnosis not present

## 2018-06-26 DIAGNOSIS — K6289 Other specified diseases of anus and rectum: Secondary | ICD-10-CM

## 2018-06-26 MED ORDER — SODIUM CHLORIDE 0.9 % IV SOLN: 500.0000 mL | Freq: Once | INTRAVENOUS | Status: DC

## 2018-06-26 NOTE — Progress Notes (Signed)
Called to room to assist during endoscopic procedure.  Patient ID and intended procedure confirmed with present staff. Received instructions for my participation in the procedure from the performing physician.  

## 2018-06-26 NOTE — Progress Notes (Signed)
Pt's states no medical or surgical changes since previsit or office visit. 

## 2018-06-26 NOTE — Patient Instructions (Signed)
YOU HAD AN ENDOSCOPIC PROCEDURE TODAY AT THE [facility]:   Refer to the procedure report that was given to you for any specific questions about what was found during the examination.  If the procedure report does not answer your questions, please call your gastroenterologist to clarify.  If you requested that your care partner not be given the details of your procedure findings, then the procedure report has been included in a sealed envelope for you to review at your convenience later.  YOU SHOULD EXPECT: Some feelings of bloating in the abdomen. Passage of more gas than usual.  Walking can help get rid of the air that was put into your GI tract during the procedure and reduce the bloating. If you had a lower endoscopy (such as a colonoscopy or flexible sigmoidoscopy) you may notice spotting of blood in your stool or on the toilet paper. If you underwent a bowel prep for your procedure, you may not have a normal bowel movement for a few days.  Please Note:  You might notice some irritation and congestion in your nose or some drainage.  This is from the oxygen used during your procedure.  There is no need for concern and it should clear up in a day or so.  SYMPTOMS TO REPORT IMMEDIATELY:   Following lower endoscopy (colonoscopy or flexible sigmoidoscopy):  Excessive amounts of blood in the stool  Significant tenderness or worsening of abdominal pains  Swelling of the abdomen that is new, acute  Fever of 100F or higher  For urgent or emergent issues, a gastroenterologist can be reached at any hour by calling [phone].   DIET:  We do recommend a small meal at first, but then you may proceed to your regular diet.  Drink plenty of fluids but you should avoid alcoholic beverages for 24 hours.  MEDICATIONS: Continue present medications.  Please see handouts given to you by your recovery nurse.  ACTIVITY:  You should plan to take it easy for the rest of today and you should NOT  DRIVE or use heavy machinery until tomorrow (because of the sedation medicines used during the test).    FOLLOW UP: Our staff will call the number listed on your records the next business day following your procedure to check on you and address any questions or concerns that you may have regarding the information given to you following your procedure. If we do not reach you, we will leave a message.  However, if you are feeling well and you are not experiencing any problems, there is no need to return our call.  We will assume that you have returned to your regular daily activities without incident.  If any biopsies were taken you will be contacted by phone or by letter within the next 1-3 weeks.  Please call us at [phone] if you have not heard about the biopsies in 3 weeks.   Thank you for allowing us to provide for your healthcare needs today.   SIGNATURES/CONFIDENTIALITY: You and/or your care partner have signed paperwork which will be entered into your electronic medical record.  These signatures attest to the fact that that the information above on your After Visit Summary has been reviewed and is understood.  Full responsibility of the confidentiality of this discharge information lies with you and/or your care-partner. 

## 2018-06-26 NOTE — Progress Notes (Signed)
Report to PACU, RN, vss, BBS= Clear.  

## 2018-06-26 NOTE — Op Note (Signed)
[name] Procedure Date: 06/26/2018 10:51 AM MRN: [id number] Endoscopist: Remo Lipps P. Havery Moros , MD Age: 37 Referring MD:  Date of Birth: 03-18-1981 Gender: Female Account #: [id number] Procedure:                Colonoscopy Indications:              High risk colon cancer surveillance: Ulcerative                            left sided colitis, now on Lialda 2.4gm / day Medicines:                Monitored Anesthesia Care Procedure:                Pre-Anesthesia Assessment:                           - Prior to the procedure, a History and Physical                            was performed, and patient medications and                            allergies were reviewed. The patient's tolerance of                            previous anesthesia was also reviewed. The risks                            and benefits of the procedure and the sedation                            options and risks were discussed with the patient.                            All questions were answered, and informed consent                            was obtained. Prior Anticoagulants: The patient has                            taken no previous anticoagulant or antiplatelet                            agents. ASA Grade Assessment: II - A patient with                            mild systemic disease. After reviewing the risks                            and benefits, the patient was deemed in                            satisfactory condition to undergo the procedure.  After obtaining informed consent, the colonoscope                            was passed under direct vision. Throughout the                            procedure, the patient's blood pressure, pulse, and                            oxygen saturations were monitored continuously. The                            Model PCF-[id number]) scope was introduced   through the anus and advanced to the the terminal                            ileum, with identification of the appendiceal                            orifice and IC valve. The colonoscopy was performed                            without difficulty. The patient tolerated the                            procedure well. The quality of the bowel                            preparation was good. The terminal ileum, ileocecal                            valve, appendiceal orifice, and rectum were                            photographed. Scope In: 11:01:21 AM Scope Out: 11:22:06 AM Scope Withdrawal Time: 0 hours 17 minutes 45 seconds  Total Procedure Duration: 0 hours 20 minutes 45 seconds  Findings:                 The perianal and digital rectal examinations were                            normal.                           The terminal ileum appeared normal.                           A 7 mm polyp was found in the hepatic flexure. The                            polyp was flat. The polyp was removed with a cold                            snare. Resection and retrieval were complete.  Diffuse mild inflammation characterized by altered                            vascularity, erosions, erythema and friability was                            found in the distal rectum. Biopsies were taken                            with a cold forceps for histology.                           There was a small cecal patch around the                            appendiceal orifice of inflammation. The exam was                            otherwise without abnormality. The left colon                            otherwise had no inflammation.                           Biopsies were taken with a cold forceps in the                            sigmoid colon and in the descending colon for                            histology. Complications:            No immediate complications. Estimated blood loss:                             Minimal. Estimated Blood Loss:     Estimated blood loss was minimal. Impression:               - One 7 mm polyp at the hepatic flexure, removed                            with a cold snare. Resected and retrieved.                           - Diffuse mild inflammation was found in the distal                            rectum. Biopsied.                           - The examination was otherwise normal.                           - Biopsies were taken with a cold forceps for  histology in the sigmoid colon and in the                            descending colon.                           Overall good control of left colon with exception                            of mild proctitis Recommendation:           - Patient has a contact number available for                            emergencies. The signs and symptoms of potential                            delayed complications were discussed with the                            patient. Return to normal activities tomorrow.                            Written discharge instructions were provided to the                            patient.                           - Resume previous diet.                           - Continue present medications.                           - Await pathology results.                           - Would add Canasa daily or a few times per week                            for proctitis if willing to do this. Other options                            are to use Rowasa, or increase Lialda (although                            unclear benefit of higher dose Lialda to treat                            distal proctitis). Will discuss with patient Barbara Hall. Armbruster, MD 06/26/2018 11:31:17 AM This report has been signed electronically.

## 2018-06-27 ENCOUNTER — Telehealth: Payer: Self-pay

## 2018-06-27 NOTE — Telephone Encounter (Signed)
Left message on answering machine. 

## 2018-06-27 NOTE — Telephone Encounter (Signed)
Second follow up phone call attempt, no answer, message left

## 2018-08-02 ENCOUNTER — Other Ambulatory Visit: Payer: Self-pay | Admitting: Gastroenterology

## 2018-11-30 ENCOUNTER — Other Ambulatory Visit: Payer: Self-pay | Admitting: Gastroenterology

## 2018-12-26 DIAGNOSIS — Z6823 Body mass index (BMI) 23.0-23.9, adult: Secondary | ICD-10-CM | POA: Diagnosis not present

## 2018-12-26 DIAGNOSIS — Z01419 Encounter for gynecological examination (general) (routine) without abnormal findings: Secondary | ICD-10-CM | POA: Diagnosis not present

## 2018-12-31 ENCOUNTER — Other Ambulatory Visit (INDEPENDENT_AMBULATORY_CARE_PROVIDER_SITE_OTHER): Payer: BLUE CROSS/BLUE SHIELD

## 2018-12-31 ENCOUNTER — Ambulatory Visit (INDEPENDENT_AMBULATORY_CARE_PROVIDER_SITE_OTHER): Payer: BLUE CROSS/BLUE SHIELD | Admitting: Gastroenterology

## 2018-12-31 ENCOUNTER — Encounter: Payer: Self-pay | Admitting: Gastroenterology

## 2018-12-31 VITALS — BP 88/60 | HR 68 | Ht 67.0 in | Wt 154.0 lb

## 2018-12-31 DIAGNOSIS — K5120 Ulcerative (chronic) proctitis without complications: Secondary | ICD-10-CM

## 2018-12-31 LAB — COMPREHENSIVE METABOLIC PANEL
ALT: 7 U/L (ref 0–35)
AST: 11 U/L (ref 0–37)
Albumin: 4.1 g/dL (ref 3.5–5.2)
Alkaline Phosphatase: 37 U/L — ABNORMAL LOW (ref 39–117)
BUN: 12 mg/dL (ref 6–23)
CO2: 28 mEq/L (ref 19–32)
Calcium: 9.2 mg/dL (ref 8.4–10.5)
Chloride: 103 mEq/L (ref 96–112)
Creatinine, Ser: 0.71 mg/dL (ref 0.40–1.20)
GFR: 91.94 mL/min (ref >60.00)
Glucose, Bld: 84 mg/dL (ref 70–99)
Potassium: 3.9 mEq/L (ref 3.5–5.1)
Sodium: 137 mEq/L (ref 135–145)
Total Bilirubin: 0.4 mg/dL (ref 0.2–1.2)
Total Protein: 6.7 g/dL (ref 6.0–8.3)

## 2018-12-31 LAB — CBC WITH DIFFERENTIAL/PLATELET
Basophils Absolute: 0 10*3/uL (ref 0.0–0.1)
Basophils Relative: 0.6 % (ref 0.0–3.0)
Eosinophils Absolute: 0.1 10*3/uL (ref 0.0–0.7)
Eosinophils Relative: 1.2 % (ref 0.0–5.0)
HCT: 38 % (ref 36.0–46.0)
Hemoglobin: 12.5 g/dL (ref 12.0–15.0)
Lymphocytes Relative: 35.6 % (ref 12.0–46.0)
Lymphs Abs: 2.8 10*3/uL (ref 0.7–4.0)
MCHC: 32.9 g/dL (ref 30.0–36.0)
MCV: 86.2 fl (ref 78.0–100.0)
Monocytes Absolute: 0.8 10*3/uL (ref 0.1–1.0)
Monocytes Relative: 10.1 % (ref 3.0–12.0)
Neutro Abs: 4.2 10*3/uL (ref 1.4–7.7)
Neutrophils Relative %: 52.5 % (ref 43.0–77.0)
Platelets: 296 10*3/uL (ref 150.0–400.0)
RBC: 4.41 Mil/uL (ref 3.87–5.11)
RDW: 13.8 % (ref 11.5–15.5)
WBC: 7.9 10*3/uL (ref 4.0–10.5)

## 2018-12-31 LAB — VITAMIN D 25 HYDROXY (VIT D DEFICIENCY, FRACTURES): VITD: 51.27 ng/mL (ref 30.00–100.00)

## 2018-12-31 MED ORDER — MESALAMINE 1000 MG RE SUPP: 1000.0000 mg | Freq: Every day | RECTAL | 3 refills | Status: DC

## 2018-12-31 NOTE — Patient Instructions (Addendum)
If you are age 39 or older, your body mass index should be between 23-30. Your Body mass index is 24.12 kg/m. If this is out of the aforementioned range listed, please consider follow up with your Primary Care Provider.  If you are age 62 or younger, your body mass index should be between 19-25. Your Body mass index is 24.12 kg/m. If this is out of the aformentioned range listed, please consider follow up with your Primary Care Provider.   We have sent the following medications to your pharmacy for you to pick up at your convenience: Canasa suppositories  Please go to the lab in the basement of our building to have lab work done as you leave today. Hit "B" for basement when you get on the elevator.  When the doors open the lab is on your left.  We will call you with the results. Thank you.  (labs ordered by Dr. Havery Moros)  Thank you for entrusting me with your care and for choosing [facility], Dr. [place] Cellar

## 2018-12-31 NOTE — Progress Notes (Signed)
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 Endoscopic history as outlined below.Treated with mesalamine over the years which works well for her but compliance has been an issue. No hospitalizations since diagnosis. Endoscopies have been done at times of flaring and not when feeling well.  She has no FH of Crohns or colitis. On mother's side, grandmother and aunt had colon cancer.   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 Since her last visit she had a colonoscopy in August which showed that she had some very mild inflammation in the distal rectum only, rest her colon was normal with exception of a 7 mm sessile serrated polyp in the hepatic flexure. She's been taking Lialda reliably since her last seen her, 2 tablets a day. She is also been using Canasa as needed although not using it routinely. She does think Canasa helped significantly when she takes it. She had perhaps a very mild flare of her symptoms since her last seen her but in general has been doing pretty well. She has 2 bowel movements a day, no blood in her stools. No abdominal pains that bother her. She is taking vitamin D supplementation routinely. Overall she's fairly happy with her regimen at present, she does not mind using the Canasa.     Past Medical History:  Diagnosis Date  . Anemia   . [place]   . Hemorrhoids   . Mastitis   . Postpartum examination following vaginal delivery (11/9) 09/09/2011     Past Surgical History:  Procedure Laterality Date  . BARTHOLIN GLAND CYST EXCISION  2006   Family History  Problem Relation Age of  Onset  . Colon cancer Maternal Grandmother        15 yrs ago dx   . Colon polyps Mother    Social History   Tobacco Use  . Smoking status: Never Smoker  . Smokeless tobacco: Never Used  Substance Use Topics  . Alcohol use: No  . Drug use: No   Current Outpatient Medications  Medication Sig Dispense Refill  . dicyclomine (BENTYL) 20 MG tablet Take 1 tablet (20 mg total) by mouth every 8 (eight) hours as needed for spasms. 90 tablet 2  . mesalamine (CANASA) 1000 MG suppository Place 1 suppository (1,000 mg total) rectally at bedtime. 30 suppository 2  . mesalamine (LIALDA) 1.2 g EC tablet Take 1 tablet (1.2 g total) by mouth 2 (two) times daily. Please schedule a follow up at Dr. AHavery Morosin March or April: [phone], #2 60 tablet 1  . TRI-PREVIFEM 0.18/0.215/0.25 MG-35 MCG tablet Take 1 tablet by mouth daily.  12   Current Facility-Administered Medications  Medication Dose Route Frequency Provider Last Rate Last Dose  . 0.9 %  sodium chloride infusion  500 mL Intravenous Once Neftali Abair, SCarlota Raspberry MD       No Known Allergies   Review of Systems: All systems reviewed and negative except where noted in HPI.   Lab Results  Component Value Date   WBC 7.9 12/31/2018   HGB 12.5 12/31/2018   HCT 38.0 12/31/2018   MCV 86.2 12/31/2018   PLT 296.0  12/31/2018   Lab Results  Component Value Date   CREATININE 0.71 12/31/2018   BUN 12 12/31/2018   NA 137 12/31/2018   K 3.9 12/31/2018   CL 103 12/31/2018   CO2 28 12/31/2018    Lab Results  Component Value Date   ALT 7 12/31/2018   AST 11 12/31/2018   ALKPHOS 37 (L) 12/31/2018   BILITOT 0.4 12/31/2018     Physical Exam: BP (!) 88/60   Pulse 68   Ht 5' 7"  (1.702 m)   Wt 154 lb (69.9 kg)   LMP 12/02/2018 (Approximate)   BMI 24.12 kg/m  Constitutional: Pleasant,well-developed, female in no acute distress. HEENT: Normocephalic and atraumatic. Conjunctivae are normal. No scleral icterus. Neck supple.  Cardiovascular:  Normal rate, regular rhythm.  Pulmonary/chest: Effort normal and breath sounds normal. No wheezing, rales or rhonchi. Abdominal: Soft, nondistended, nontender. There are no masses palpable. No hepatomegaly. Extremities: no edema Lymphadenopathy: No cervical adenopathy noted. Neurological: Alert and oriented to person place and time. Skin: Skin is warm and dry. No rashes noted. Psychiatric: Normal mood and affect. Behavior is normal.   ASSESSMENT AND PLAN: 38 year old female here for reassessment following:  Ulcerative colitis - on review of prior endoscopies extent of disease appears to be mostly distal, upwards of 15-20 cm from the anal verge. She's had mildly active proctitis on her prior endoscopies. I think topical therapy with Canasa or Rowasa would be more effective for her distal disease then Lialda, we discussed if she was willing to primarily use topical therapy as she has had some difficulty with compliance of Lialda in the past. She tolerates the suppositories and can handle that, she is afraid that she will not tolerate the enema as well and wanted to avoid that. In this setting while her symptoms are fairly mild and she generally has good control of disease, long-term we want to minimize her inflammation to reduce risk for colon cancer. We'll try using Canasa every night to every other night as primary therapy for now. We'll see how she does with this. She'll contact me if this is not working as well she can resume Lialda. Otherwise due for basic labs today and will recheck vitamin D. She agreed with the plan, questions answered. I will see her again in 6 months.  [place] Cellar, MD [facility]

## 2019-01-24 ENCOUNTER — Other Ambulatory Visit: Payer: Self-pay | Admitting: Gastroenterology

## 2019-01-24 NOTE — Telephone Encounter (Signed)
Dr. Havery Moros - Pt seen 12-31-18.OK to resume Lialda 1.2 bid? If so please indicate # and refills. Thank you

## 2019-01-24 NOTE — Telephone Encounter (Signed)
Yes can refill it at twice daily. 90 day supply with 3 refills. Thanks

## 2019-03-31 ENCOUNTER — Telehealth: Payer: Self-pay

## 2019-03-31 NOTE — Telephone Encounter (Signed)
Okay that's really unfortunate how expensive they are, she can let us know how she wishes to proceed. Thanks much for clarifying.

## 2019-03-31 NOTE — Telephone Encounter (Signed)
Called and spoke to pt. She Was able to get the canasa suppositories b/c she had met her deductible. They are helpful. Her insurance plan year reset May 1 so now she has to meet her deductible again.  We discussed her options (Rowasa) and she would like to stay with the Canasa even though they are $1000 for a 90 day supply. She would Not qualify for pt assistance. She will let us know if she wants to do anything different.

## 2019-03-31 NOTE — Telephone Encounter (Signed)
-----   Message from Yetta Flock, MD sent at 03/31/2019  2:52 PM EDT ----- Regarding: RE: canasa suppositories too expensive Had she been getting this, and now can't afford it due to insurance change? I discussed using this a few months ago with her but assumed she got it already. Not sure if her insurance company would cover Rowasa enemas q HS, patient had not wished to use Rowasa before. Any way she can apply for assistance for Canasa? Thanks ----- Message ----- From: Roetta Sessions, CMA Sent: 03/31/2019   8:43 AM EDT To: Yetta Flock, MD Subject: canasa suppositories too expensive             The Canasa suppositories would cost the patient over $1,000.   She has [employer]. Is there something else you would like her to try instead?  Thank you,  Jan

## 2019-08-08 DIAGNOSIS — Z20828 Contact with and (suspected) exposure to other viral communicable diseases: Secondary | ICD-10-CM | POA: Diagnosis not present

## 2019-08-19 DIAGNOSIS — Z20828 Contact with and (suspected) exposure to other viral communicable diseases: Secondary | ICD-10-CM | POA: Diagnosis not present

## 2019-09-30 DIAGNOSIS — Z20828 Contact with and (suspected) exposure to other viral communicable diseases: Secondary | ICD-10-CM | POA: Diagnosis not present

## 2019-10-15 ENCOUNTER — Ambulatory Visit: Payer: BC Managed Care – PPO | Attending: Internal Medicine

## 2019-10-15 ENCOUNTER — Other Ambulatory Visit: Payer: Self-pay

## 2019-10-15 DIAGNOSIS — Z20822 Contact with and (suspected) exposure to covid-19: Secondary | ICD-10-CM

## 2019-10-15 DIAGNOSIS — Z20828 Contact with and (suspected) exposure to other viral communicable diseases: Secondary | ICD-10-CM | POA: Diagnosis not present

## 2019-10-18 LAB — NOVEL CORONAVIRUS, NAA: SARS-CoV-2, NAA: NOT DETECTED

## 2019-11-03 ENCOUNTER — Other Ambulatory Visit: Payer: Self-pay

## 2019-11-03 ENCOUNTER — Ambulatory Visit: Payer: BC Managed Care – PPO | Attending: Internal Medicine

## 2019-11-03 DIAGNOSIS — Z20822 Contact with and (suspected) exposure to covid-19: Secondary | ICD-10-CM | POA: Diagnosis not present

## 2019-11-04 LAB — NOVEL CORONAVIRUS, NAA: SARS-CoV-2, NAA: DETECTED — AB

## 2019-12-01 ENCOUNTER — Other Ambulatory Visit: Payer: Self-pay | Admitting: Gastroenterology

## 2019-12-01 MED ORDER — MESALAMINE 1.2 G PO TBEC: 1.2000 g | DELAYED_RELEASE_TABLET | Freq: Two times a day (BID) | ORAL | 0 refills | Status: DC

## 2019-12-01 NOTE — Telephone Encounter (Signed)
Refilled Lialda

## 2019-12-01 NOTE — Telephone Encounter (Signed)
Pt is scheduled for an OV 01/02/20 and requested a refill for Lialda.

## 2020-01-02 ENCOUNTER — Ambulatory Visit (INDEPENDENT_AMBULATORY_CARE_PROVIDER_SITE_OTHER): Payer: BC Managed Care – PPO | Admitting: Gastroenterology

## 2020-01-02 ENCOUNTER — Other Ambulatory Visit (INDEPENDENT_AMBULATORY_CARE_PROVIDER_SITE_OTHER): Payer: BC Managed Care – PPO

## 2020-01-02 ENCOUNTER — Other Ambulatory Visit: Payer: Self-pay

## 2020-01-02 ENCOUNTER — Encounter: Payer: Self-pay | Admitting: Gastroenterology

## 2020-01-02 VITALS — BP 100/70 | HR 84 | Temp 98.7°F | Ht 67.0 in | Wt 158.0 lb

## 2020-01-02 DIAGNOSIS — K5120 Ulcerative (chronic) proctitis without complications: Secondary | ICD-10-CM | POA: Diagnosis not present

## 2020-01-02 LAB — CBC WITH DIFFERENTIAL/PLATELET
Basophils Absolute: 0.1 10*3/uL (ref 0.0–0.1)
Basophils Relative: 1 % (ref 0.0–3.0)
Eosinophils Absolute: 0.3 10*3/uL (ref 0.0–0.7)
Eosinophils Relative: 2.9 % (ref 0.0–5.0)
HCT: 39.8 % (ref 36.0–46.0)
Hemoglobin: 13.2 g/dL (ref 12.0–15.0)
Lymphocytes Relative: 42.1 % (ref 12.0–46.0)
Lymphs Abs: 3.7 10*3/uL (ref 0.7–4.0)
MCHC: 33.1 g/dL (ref 30.0–36.0)
MCV: 86.2 fl (ref 78.0–100.0)
Monocytes Absolute: 0.9 10*3/uL (ref 0.1–1.0)
Monocytes Relative: 10.3 % (ref 3.0–12.0)
Neutro Abs: 3.8 10*3/uL (ref 1.4–7.7)
Neutrophils Relative %: 43.7 % (ref 43.0–77.0)
Platelets: 315 10*3/uL (ref 150.0–400.0)
RBC: 4.61 Mil/uL (ref 3.87–5.11)
RDW: 14.2 % (ref 11.5–15.5)
WBC: 8.8 10*3/uL (ref 4.0–10.5)

## 2020-01-02 LAB — VITAMIN D 25 HYDROXY (VIT D DEFICIENCY, FRACTURES): VITD: 44.51 ng/mL (ref 30.00–100.00)

## 2020-01-02 LAB — COMPREHENSIVE METABOLIC PANEL
ALT: 8 U/L (ref 0–35)
AST: 13 U/L (ref 0–37)
Albumin: 4.3 g/dL (ref 3.5–5.2)
Alkaline Phosphatase: 43 U/L (ref 39–117)
BUN: 13 mg/dL (ref 6–23)
CO2: 31 mEq/L (ref 19–32)
Calcium: 9.8 mg/dL (ref 8.4–10.5)
Chloride: 101 mEq/L (ref 96–112)
Creatinine, Ser: 0.68 mg/dL (ref 0.40–1.20)
GFR: 96.13 mL/min (ref >60.00)
Glucose, Bld: 84 mg/dL (ref 70–99)
Potassium: 3.7 mEq/L (ref 3.5–5.1)
Sodium: 137 mEq/L (ref 135–145)
Total Bilirubin: 0.4 mg/dL (ref 0.2–1.2)
Total Protein: 7.8 g/dL (ref 6.0–8.3)

## 2020-01-02 MED ORDER — HYDROCORTISONE ACETATE 25 MG RE SUPP: 25.0000 mg | Freq: Two times a day (BID) | RECTAL | 0 refills | Status: AC

## 2020-01-02 MED ORDER — MESALAMINE 1.2 G PO TBEC: 2.4000 g | DELAYED_RELEASE_TABLET | Freq: Two times a day (BID) | ORAL | 3 refills | Status: DC

## 2020-01-02 MED ORDER — MESALAMINE 1000 MG RE SUPP: 1000.0000 mg | Freq: Every day | RECTAL | 3 refills | Status: DC

## 2020-01-02 NOTE — Progress Notes (Signed)
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 Endoscopic history as outlined below.Treated with mesalamine over the years which works well for her but compliance has been an issue. No hospitalizations since diagnosis. Endoscopies have been done at times of flaring and not when feeling well.  She has no FH of Crohns or colitis. On mother's side, grandmother and aunt had colon cancer.   Colonoscopy 06/26/2018 - normal TI, 31m flat lesion in hepatic flexure - SSP, mild inflammation in distal rectum, small cecal patch, otherwise normal -  Colonoscopy 2007 - left sided colitis Colonoscopy 2010 - active proctitis only Flex sig 2013 - left sided colitis   SINCE LAST VISIT:  39year old female here for follow-up for her colitis.  I last saw her in March of last year.  At that time we had discussed using Canasa more frequently and continuing Lialda daily.  She states over the past year in general she is felt well without any breakthrough of her symptoms.  For the most part she was using Canasa twice a week and Lialda daily and have been doing quite well on the regimen.  She unfortunately got coronavirus in January and has had some fatigue since that happened which has not resolved.  Otherwise she has been doing okay however has had some slight increase in stool frequency to 3 bowel movements a day, along with some urgency and rare bleeding.  She states she feels like she is having a very mild flare.  In the past few weeks she started using her Canasa daily and states things have slowly improved but not yet back to baseline.  She denies NSAIDs.  This is the first flare she has had in fair amount of time.  She denies any abdominal pains.  Eating well, no nausea or vomiting.    Past Medical History:  Diagnosis Date  . Anemia   . Colitis, chronic, ulcerative ([place]   . Hemorrhoids   . Mastitis   . Postpartum examination following vaginal delivery (11/9)  09/09/2011     Past Surgical History:  Procedure Laterality Date  . BARTHOLIN GLAND CYST EXCISION  2006   Family History  Problem Relation Age of Onset  . Colon cancer Maternal Grandmother        15 yrs ago dx   . Colon polyps Mother    Social History   Tobacco Use  . Smoking status: Never Smoker  . Smokeless tobacco: Never Used  Substance Use Topics  . Alcohol use: No  . Drug use: No   Current Outpatient Medications  Medication Sig Dispense Refill  . dicyclomine (BENTYL) 20 MG tablet Take 1 tablet (20 mg total) by mouth every 8 (eight) hours as needed for spasms. 90 tablet 2  . mesalamine (CANASA) 1000 MG suppository Place 1 suppository (1,000 mg total) rectally at bedtime. 90 suppository 3  . mesalamine (LIALDA) 1.2 g EC tablet Take 1 tablet (1.2 g total) by mouth 2 (two) times daily. 90 tablet 0  . TRI-PREVIFEM 0.18/0.215/0.25 MG-35 MCG tablet Take 1 tablet by mouth daily.  12   Current Facility-Administered Medications  Medication Dose Route Frequency Provider Last Rate Last Admin  . 0.9 %  sodium chloride infusion  500 mL Intravenous Once Kayhan Boardley, SCarlota Raspberry MD       No Known Allergies   Review of Systems: All systems reviewed and negative except where noted in HPI.   Lab Results  Component Value Date  WBC 7.9 12/31/2018   HGB 12.5 12/31/2018   HCT 38.0 12/31/2018   MCV 86.2 12/31/2018   PLT 296.0 12/31/2018    Lab Results  Component Value Date   CREATININE 0.71 12/31/2018   BUN 12 12/31/2018   NA 137 12/31/2018   K 3.9 12/31/2018   CL 103 12/31/2018   CO2 28 12/31/2018    Lab Results  Component Value Date   ALT 7 12/31/2018   AST 11 12/31/2018   ALKPHOS 37 (L) 12/31/2018   BILITOT 0.4 12/31/2018     Physical Exam: BP 100/70   Pulse 84   Temp 98.7 F (37.1 C)   Ht 5' 7"  (1.702 m)   Wt 158 lb (71.7 kg)   BMI 24.75 kg/m  Constitutional: Pleasant,well-developed, female in no acute distress. Neurological: Alert and oriented to person  place and time. Psychiatric: Normal mood and affect. Behavior is normal.   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  She is had very mild only active colitis on her last exam, she has been using Canasa daily for the past few weeks and Lialda every day and has improved from her mild flare but not yet back to baseline.  We discussed options. Topical steroid therapy is likely provide good benefit for her without systemic side effects of oral steroids.  She does not like using enemas.  Recommend either use of Uceris foam 2 mg twice a day for 2 weeks versus hydrocortisone suppository twice daily for 2 weeks.  I think hydrocortisone may be a cheaper option.  I asked her to use this twice a day for the next 2 weeks and continue Canasa daily as well as the [place].  If symptoms fail to improve on the regimen I asked her to contact me.  Otherwise she is due for basic labs, will check hemoglobin to make sure no anemia given her fatigue.  She has a history of vitamin D deficiency and will recheck her vitamin D level.  Otherwise we will plan on seeing her again in 6 months for follow-up unless not feeling better in the interim.  She agreed  I spent 30 minutes of time, including in depth chart review, independent review of results as outlined above, communicating results with the patient directly, face-to-face time with the patient, coordinating care, and ordering studies and medications as appropriate, and documenting this encounter.  [place] Cellar, MD [facility]

## 2020-01-02 NOTE — Patient Instructions (Addendum)
If you are age 40 or older, your body mass index should be between 23-30. Your Body mass index is 24.75 kg/m. If this is out of the aforementioned range listed, please consider follow up with your Primary Care Provider.  If you are age 38 or younger, your body mass index should be between 19-25. Your Body mass index is 24.75 kg/m. If this is out of the aformentioned range listed, please consider follow up with your Primary Care Provider.   Please go to the lab in the basement of our building to have lab work done as you leave today. Hit "B" for basement when you get on the elevator.  When the doors open the lab is on your left.  We will call you with the results. Thank you.  We have sent the following medications to your pharmacy for you to pick up at your convenience: Lialda Canasa - Use EVERY DAY  Anusol suppositories 25 mg: Insert rectally twice a day for 14 days (Note: if this is too expensive, please let me know and I will send a different prescription for Uceris Rectal Foam to see if it is covered better by your insurance).  Thank you for entrusting me with your care and for choosing [facility], Dr. [place] Cellar

## 2020-02-12 DIAGNOSIS — Z01419 Encounter for gynecological examination (general) (routine) without abnormal findings: Secondary | ICD-10-CM | POA: Diagnosis not present

## 2020-02-12 DIAGNOSIS — Z6823 Body mass index (BMI) 23.0-23.9, adult: Secondary | ICD-10-CM | POA: Diagnosis not present

## 2020-05-10 ENCOUNTER — Encounter: Payer: Self-pay | Admitting: Gastroenterology

## 2020-06-02 ENCOUNTER — Other Ambulatory Visit: Payer: Self-pay | Admitting: Gastroenterology

## 2020-06-02 DIAGNOSIS — R05 Cough: Secondary | ICD-10-CM | POA: Diagnosis not present

## 2020-06-02 DIAGNOSIS — R0981 Nasal congestion: Secondary | ICD-10-CM | POA: Diagnosis not present

## 2020-06-02 DIAGNOSIS — B349 Viral infection, unspecified: Secondary | ICD-10-CM | POA: Diagnosis not present

## 2020-06-02 DIAGNOSIS — M791 Myalgia, unspecified site: Secondary | ICD-10-CM | POA: Diagnosis not present

## 2020-06-14 ENCOUNTER — Other Ambulatory Visit: Payer: Self-pay | Admitting: Gastroenterology

## 2020-07-26 DIAGNOSIS — Z20822 Contact with and (suspected) exposure to covid-19: Secondary | ICD-10-CM | POA: Diagnosis not present

## 2020-10-14 ENCOUNTER — Other Ambulatory Visit: Payer: Self-pay | Admitting: Gastroenterology

## 2020-11-16 ENCOUNTER — Ambulatory Visit: Payer: BC Managed Care – PPO | Admitting: Gastroenterology

## 2020-12-28 ENCOUNTER — Ambulatory Visit: Payer: BC Managed Care – PPO | Admitting: Gastroenterology

## 2020-12-28 ENCOUNTER — Other Ambulatory Visit (INDEPENDENT_AMBULATORY_CARE_PROVIDER_SITE_OTHER): Payer: BC Managed Care – PPO

## 2020-12-28 ENCOUNTER — Encounter: Payer: Self-pay | Admitting: Gastroenterology

## 2020-12-28 VITALS — BP 100/60 | HR 59 | Ht 67.0 in | Wt 152.0 lb

## 2020-12-28 DIAGNOSIS — K5130 Ulcerative (chronic) rectosigmoiditis without complications: Secondary | ICD-10-CM | POA: Diagnosis not present

## 2020-12-28 LAB — CBC WITH DIFFERENTIAL/PLATELET
Basophils Absolute: 0 10*3/uL (ref 0.0–0.1)
Basophils Relative: 0.5 % (ref 0.0–3.0)
Eosinophils Absolute: 0.1 10*3/uL (ref 0.0–0.7)
Eosinophils Relative: 1.2 % (ref 0.0–5.0)
HCT: 39.4 % (ref 36.0–46.0)
Hemoglobin: 13.1 g/dL (ref 12.0–15.0)
Lymphocytes Relative: 30.4 % (ref 12.0–46.0)
Lymphs Abs: 1.9 10*3/uL (ref 0.7–4.0)
MCHC: 33.4 g/dL (ref 30.0–36.0)
MCV: 85.2 fl (ref 78.0–100.0)
Monocytes Absolute: 0.5 10*3/uL (ref 0.1–1.0)
Monocytes Relative: 8.5 % (ref 3.0–12.0)
Neutro Abs: 3.8 10*3/uL (ref 1.4–7.7)
Neutrophils Relative %: 59.4 % (ref 43.0–77.0)
Platelets: 281 10*3/uL (ref 150.0–400.0)
RBC: 4.62 Mil/uL (ref 3.87–5.11)
RDW: 14.2 % (ref 11.5–15.5)
WBC: 6.3 10*3/uL (ref 4.0–10.5)

## 2020-12-28 LAB — COMPREHENSIVE METABOLIC PANEL
ALT: 8 U/L (ref 0–35)
AST: 12 U/L (ref 0–37)
Albumin: 4.1 g/dL (ref 3.5–5.2)
Alkaline Phosphatase: 33 U/L — ABNORMAL LOW (ref 39–117)
BUN: 15 mg/dL (ref 6–23)
CO2: 29 mEq/L (ref 19–32)
Calcium: 9.7 mg/dL (ref 8.4–10.5)
Chloride: 102 mEq/L (ref 96–112)
Creatinine, Ser: 0.73 mg/dL (ref 0.40–1.20)
GFR: 102.9 mL/min (ref >60.00)
Glucose, Bld: 81 mg/dL (ref 70–99)
Potassium: 3.6 mEq/L (ref 3.5–5.1)
Sodium: 137 mEq/L (ref 135–145)
Total Bilirubin: 0.6 mg/dL (ref 0.2–1.2)
Total Protein: 7.4 g/dL (ref 6.0–8.3)

## 2020-12-28 MED ORDER — MESALAMINE 1.2 G PO TBEC: 2.4000 g | DELAYED_RELEASE_TABLET | Freq: Every day | ORAL | 3 refills | Status: DC

## 2020-12-28 MED ORDER — MESALAMINE 1000 MG RE SUPP: 1000.0000 mg | Freq: Every day | RECTAL | 3 refills | Status: DC

## 2020-12-28 MED ORDER — SUTAB 1479-225-188 MG PO TABS: 1.0000 | ORAL_TABLET | Freq: Once | ORAL | 0 refills | Status: AC

## 2020-12-28 NOTE — Patient Instructions (Addendum)
If you are age 41 or older, your body mass index should be between 23-30. Your Body mass index is 23.81 kg/m. If this is out of the aforementioned range listed, please consider follow up with your Primary Care Provider.  If you are age 45 or younger, your body mass index should be between 19-25. Your Body mass index is 23.81 kg/m. If this is out of the aformentioned range listed, please consider follow up with your Primary Care Provider.   Please go to the lab in the basement of our building to have lab work done as you leave today. Hit "B" for basement when you get on the elevator.  When the doors open the lab is on your left.  We will call you with the results. Thank you.  Due to recent changes in healthcare laws, you may see the results of your imaging and laboratory studies on MyChart before your provider has had a chance to review them.  We understand that in some cases there may be results that are confusing or concerning to you. Not all laboratory results come back in the same time frame and the provider may be waiting for multiple results in order to interpret others.  Please give [place] 48 hours in order for your provider to thoroughly review all the results before contacting the office for clarification of your results.    We have sent the following medications to your pharmacy for you to pick up at your convenience: [facility] have been scheduled for a colonoscopy. Please follow written instructions given to you at your visit today.  Please pick up your prep supplies at the pharmacy within the next 1-3 days. If you use inhalers (even only as needed), please bring them with you on the day of your procedure.  [facility] is accepting new patients.  Their address is [address], [place], [place] [address] and the number is: [phone]   Thank you for entrusting me with your care and for choosing [facility], Dr. [place] Cellar

## 2020-12-28 NOTE — Progress Notes (Signed)
HPI :  UC HISTORY: Left sided/ proctitisulcerative colitis diagnosed with UC in 2007. Treated with mesalamine over the years which works well for her. No hospitalizations since diagnosis. Endoscopies have been done at times of flaring and not when feeling well, or when off medications.  She has no FH of Crohns or colitis. On mother's side, grandmother and aunt had colon cancer.  Colonoscopy 06/26/2018 - normal TI, 75m flat lesion in hepatic flexure - SSP, mild inflammation in distal rectum, small cecal patch, otherwise normal - Colonoscopy 2007 - left sided colitis Colonoscopy 2010 - active proctitis only Flex sig 2013 - left sided colitis   SINCE LAST VISIT:  40year old female here for reassessment for her colitis.  Its been about a year since have last seen her.  Since that time she has been taking Canasa every other day and had been compliant with Lialda every day but ran out of the medicine about 1 month ago.  She has not had any flares over the past year.  She has no symptoms that have been bothering her in general.  Bowel habits are regular, no blood in the stools.  No routine abdominal pain.  She is happy with the regimen and this has been working for her.  She does not have a primary care physician currently and inquires about how to get plugged in with a new primary care.  We reviewed her prior colonoscopy exams and discussed timing of her next surveillance.  It has been about 15 years since her initial diagnosis. She is also due for routine labs today.  Vitamin D level checked on her last visit and was normal.   Past Medical History:  Diagnosis Date  . Anemia   . Colitis, chronic, ulcerative ([place]   . Hemorrhoids   . Mastitis   . Postpartum examination following vaginal delivery (11/9) 09/09/2011     Past Surgical History:  Procedure Laterality Date  . BARTHOLIN GLAND CYST EXCISION  2006   Family History  Problem Relation Age of Onset  . Colon cancer  Maternal Grandmother        15 yrs ago dx (1998)  . Colon polyps Mother    Social History   Tobacco Use  . Smoking status: Never Smoker  . Smokeless tobacco: Never Used  Substance Use Topics  . Alcohol use: No  . Drug use: No   Current Outpatient Medications  Medication Sig Dispense Refill  . dicyclomine (BENTYL) 20 MG tablet Take 1 tablet (20 mg total) by mouth every 8 (eight) hours as needed for spasms. 90 tablet 2  . mesalamine (CANASA) 1000 MG suppository PLACE 1 SUPPOSITORY (1,000 MG TOTAL) RECTALLY AT BEDTIME. 90 suppository 3  . mesalamine (LIALDA) 1.2 g EC tablet Take 2 tablets (2.4 g total) by mouth 2 (two) times daily. Please schedule a 6 month follow up for further refills. Thank you 60 tablet 1  . TRI-PREVIFEM 0.18/0.215/0.25 MG-35 MCG tablet Take 1 tablet by mouth daily.  12   No current facility-administered medications for this visit.   No Known Allergies   Review of Systems: All systems reviewed and negative except where noted in HPI.   Lab Results  Component Value Date   WBC 8.8 01/02/2020   HGB 13.2 01/02/2020   HCT 39.8 01/02/2020   MCV 86.2 01/02/2020   PLT 315.0 01/02/2020    Lab Results  Component Value Date   CREATININE 0.68 01/02/2020   BUN 13 01/02/2020   NA 137 01/02/2020  K 3.7 01/02/2020   CL 101 01/02/2020   CO2 31 01/02/2020   Lab Results  Component Value Date   ALT 8 01/02/2020   AST 13 01/02/2020   ALKPHOS 43 01/02/2020   BILITOT 0.4 01/02/2020     Physical Exam: BP 100/60   Pulse (!) 59   Ht 5' 7"  (1.702 m)   Wt 152 lb (68.9 kg)   BMI 23.81 kg/m  Constitutional: Pleasant,well-developed, female in no acute distress. Neurological: Alert and oriented to person place and time. Psychiatric: Normal mood and affect. Behavior is normal.   ASSESSMENT AND PLAN: 40 year old female here for reassessment of the following issues:  Ulcerative colitis - left sided - based on prior colonoscopies extent of disease has been mostly  distal colon/rectum.  In general she has mild disease.  Since her last visit she has been on Canasa every other day and Lialda every day and has not had any flares of symptoms at all and doing well.  Her last flare was over a year ago.  Her last colonoscopy she had some very mild rectal inflammation as well as a sessile serrated polyp in the right colon.  Since that time we have added Canasa on a more routine basis.  We discussed timing of next surveillance.  I discussed colonoscopy, risks and benefits of the exam and anesthesia.  She wants to proceed with surveillance colonoscopy at this time given her duration of disease and some inflammation on her last exam.  I would recommend we schedule this sometime in April, as she has ran out of her Doristine Johns recently and when to make sure she is on her regular medications prior to proceeding with this.  Otherwise she is due for basic labs today we will set her up for that.  She is otherwise in need of a new primary care physician will refer her to [facility] primary care. She agrees with the plan, will await colonoscopy results.   Plan: - continue Lialda, refilled today - continue Canasa - CBC, CMET today - colonoscopy in April - referral to [facility] primary care to establish with new PCP  [place] Cellar, MD [facility]

## 2021-02-25 ENCOUNTER — Encounter: Payer: Self-pay | Admitting: Gastroenterology

## 2021-03-02 ENCOUNTER — Ambulatory Visit (AMBULATORY_SURGERY_CENTER): Payer: BC Managed Care – PPO | Admitting: Gastroenterology

## 2021-03-02 ENCOUNTER — Other Ambulatory Visit: Payer: Self-pay

## 2021-03-02 ENCOUNTER — Encounter: Payer: Self-pay | Admitting: Gastroenterology

## 2021-03-02 VITALS — BP 112/70 | HR 80 | Temp 98.6°F | Resp 14 | Ht 67.0 in | Wt 152.0 lb

## 2021-03-02 DIAGNOSIS — Z1211 Encounter for screening for malignant neoplasm of colon: Secondary | ICD-10-CM | POA: Diagnosis not present

## 2021-03-02 DIAGNOSIS — K635 Polyp of colon: Secondary | ICD-10-CM | POA: Diagnosis not present

## 2021-03-02 DIAGNOSIS — K5520 Angiodysplasia of colon without hemorrhage: Secondary | ICD-10-CM | POA: Diagnosis not present

## 2021-03-02 DIAGNOSIS — K6389 Other specified diseases of intestine: Secondary | ICD-10-CM | POA: Diagnosis not present

## 2021-03-02 DIAGNOSIS — K5130 Ulcerative (chronic) rectosigmoiditis without complications: Secondary | ICD-10-CM | POA: Diagnosis not present

## 2021-03-02 DIAGNOSIS — K6289 Other specified diseases of anus and rectum: Secondary | ICD-10-CM | POA: Diagnosis not present

## 2021-03-02 MED ORDER — SODIUM CHLORIDE 0.9 % IV SOLN: 500.0000 mL | Freq: Once | INTRAVENOUS | Status: DC

## 2021-03-02 NOTE — Progress Notes (Signed)
Pt's states no medical or surgical changes since previsit or office visit. 

## 2021-03-02 NOTE — Op Note (Signed)
[facility] Patient Name: Barbara Hall Procedure Date: 03/02/2021 9:38 AM MRN: [id number] Endoscopist: Remo Lipps P. Havery Moros , MD Age: 40 Referring MD:  Date of Birth: June 12, 1981 Gender: Female Account #: [id number] Procedure:                Colonoscopy Indications:              Follow-up of chronic ulcerative proctosigmoiditis-                            on Lialda and Canasa every other day, clinically                            feeling well. Also with history of R sided sessile                            serrated polyp on last exam 2019 Medicines:                Monitored Anesthesia Care Procedure:                Pre-Anesthesia Assessment:                           - Prior to the procedure, a History and Physical                            was performed, and patient medications and                            allergies were reviewed. The patient's tolerance of                            previous anesthesia was also reviewed. The risks                            and benefits of the procedure and the sedation                            options and risks were discussed with the patient.                            All questions were answered, and informed consent                            was obtained. Prior Anticoagulants: The patient has                            taken no previous anticoagulant or antiplatelet                            agents. ASA Grade Assessment: II - A patient with                            mild systemic disease. After reviewing the risks  and benefits, the patient was deemed in                            satisfactory condition to undergo the procedure.                           After obtaining informed consent, the colonoscope                            was passed under direct vision. Throughout the                            procedure, the patient's blood pressure, pulse, and                            oxygen saturations  were monitored continuously. The                            Olympus PCF-[id number] (#[id number]) Colonoscope was                            introduced through the anus and advanced to the the                            terminal ileum, with identification of the                            appendiceal orifice and IC valve. The colonoscopy                            was performed without difficulty. The patient                            tolerated the procedure well. The quality of the                            bowel preparation was good. The terminal ileum,                            ileocecal valve, appendiceal orifice, and rectum                            were photographed. Scope In: 9:50:43 AM Scope Out: 10:16:13 AM Scope Withdrawal Time: 0 hours 21 minutes 48 seconds  Total Procedure Duration: 0 hours 25 minutes 30 seconds  Findings:                 The perianal and digital rectal examinations were                            normal.                           The terminal ileum appeared normal.  Localized mild inflammation characterized by                            erythema, granularity and loss of vascularity was                            found in the cecum (cecal patch). Biopsies were                            taken with a cold forceps for histology.                           A single small angiodysplastic lesion was found in                            the transverse colon.                           A 10 mm polyp was found in the ascending colon. The                            polyp was flat. The polyp was removed with a cold                            snare. Resection and retrieval were complete.                           Very mild inflammation characterized by altered                            vascularity and granularity was found in the distal                            rectum. Biopsies were taken with a cold forceps for                             histology.                           The exam was otherwise without abnormality.                            Generally very good control of colitis on present                            regimen, inflammation in the rectum is quite mild.                           Biopsies were taken with a cold forceps in the                            rectum, in the sigmoid colon and in the descending  colon for histology for surveillance. Complications:            No immediate complications. Estimated blood loss:                            Minimal. Estimated Blood Loss:     Estimated blood loss was minimal. Impression:               - The examined portion of the ileum was normal.                           - Small cecal patch of inflammation. Biopsied.                           - A single colonic angiodysplastic lesion.                           - One 10 mm polyp in the ascending colon, removed                            with a cold snare. Resected and retrieved.                           - Very mild inflammation was found in the distal                            rectum secondary to proctitis ulcerative colitis.                            Biopsied.                           - The examination was otherwise normal.                           - Biopsies were taken with a cold forceps for                            histology in the rectum, in the sigmoid colon and                            in the descending colon.                           Overall patient is clinically feeling well on the                            present regimen without flares. Inflammation in the                            right colon is extremely mild, do not think we need                            to escalate therapy at this time. Recommendation:           -  Patient has a contact number available for                            emergencies. The signs and symptoms of potential                            delayed  complications were discussed with the                            patient. Return to normal activities tomorrow.                            Written discharge instructions were provided to the                            patient.                           - Resume previous diet.                           - Continue present medications.                           - Await pathology results. Remo Lipps P. Reichen Hutzler, MD 03/02/2021 10:27:17 AM This report has been signed electronically.

## 2021-03-02 NOTE — Progress Notes (Signed)
To Pacu, VSS. Report to [email or web address]

## 2021-03-02 NOTE — Patient Instructions (Signed)
YOU HAD AN ENDOSCOPIC PROCEDURE TODAY AT THE  ENDOSCOPY CENTER:   Refer to the procedure report that was given to you for any specific questions about what was found during the examination.  If the procedure report does not answer your questions, please call your gastroenterologist to clarify.  If you requested that your care partner not be given the details of your procedure findings, then the procedure report has been included in a sealed envelope for you to review at your convenience later.  YOU SHOULD EXPECT: Some feelings of bloating in the abdomen. Passage of more gas than usual.  Walking can help get rid of the air that was put into your GI tract during the procedure and reduce the bloating. If you had a lower endoscopy (such as a colonoscopy or flexible sigmoidoscopy) you may notice spotting of blood in your stool or on the toilet paper. If you underwent a bowel prep for your procedure, you may not have a normal bowel movement for a few days.  Please Note:  You might notice some irritation and congestion in your nose or some drainage.  This is from the oxygen used during your procedure.  There is no need for concern and it should clear up in a day or so.  SYMPTOMS TO REPORT IMMEDIATELY:   Following lower endoscopy (colonoscopy or flexible sigmoidoscopy):  Excessive amounts of blood in the stool  Significant tenderness or worsening of abdominal pains  Swelling of the abdomen that is new, acute  Fever of 100F or higher   Following upper endoscopy (EGD)  Vomiting of blood or coffee ground material  New chest pain or pain under the shoulder blades  Painful or persistently difficult swallowing  New shortness of breath  Fever of 100F or higher  Black, tarry-looking stools  For urgent or emergent issues, a gastroenterologist can be reached at any hour by calling [phone]. Do not use MyChart messaging for urgent concerns.    DIET:  We do recommend a small meal at first, but  then you may proceed to your regular diet.  Drink plenty of fluids but you should avoid alcoholic beverages for 24 hours.  ACTIVITY:  You should plan to take it easy for the rest of today and you should NOT DRIVE or use heavy machinery until tomorrow (because of the sedation medicines used during the test).    FOLLOW UP: Our staff will call the number listed on your records 48-72 hours following your procedure to check on you and address any questions or concerns that you may have regarding the information given to you following your procedure. If we do not reach you, we will leave a message.  We will attempt to reach you two times.  During this call, we will ask if you have developed any symptoms of COVID 19. If you develop any symptoms (ie: fever, flu-like symptoms, shortness of breath, cough etc.) before then, please call [phone].  If you test positive for Covid 19 in the 2 weeks post procedure, please call and report this information to us.    If any biopsies were taken you will be contacted by phone or by letter within the next 1-3 weeks.  Please call us at [phone] if you have not heard about the biopsies in 3 weeks.    SIGNATURES/CONFIDENTIALITY: You and/or your care partner have signed paperwork which will be entered into your electronic medical record.  These signatures attest to the fact that that the information above on   your After Visit Summary has been reviewed and is understood.  Full responsibility of the confidentiality of this discharge information lies with you and/or your care-partner. 

## 2021-03-02 NOTE — Progress Notes (Signed)
Called to room to assist during endoscopic procedure.  Patient ID and intended procedure confirmed with present staff. Received instructions for my participation in the procedure from the performing physician.  

## 2021-03-02 NOTE — Progress Notes (Signed)
[place] vital signs.

## 2021-03-04 ENCOUNTER — Telehealth: Payer: Self-pay

## 2021-03-04 NOTE — Telephone Encounter (Signed)
  Follow up Call-  Call back number 03/02/2021 06/26/2018  Post procedure Call Back phone  # [phone]  Permission to leave phone message Yes Yes  Some recent data might be hidden     Patient questions:  Do you have a fever, pain , or abdominal swelling? No. Pain Score  0 *  Have you tolerated food without any problems? Yes.    Have you been able to return to your normal activities? Yes.    Do you have any questions about your discharge instructions: Diet   No. Medications  No. Follow up visit  No.  Do you have questions or concerns about your Care? No.  Actions: * If pain score is 4 or above: No action needed, pain <4. 1. Have you developed a fever since your procedure? no  2.   Have you had an respiratory symptoms (SOB or cough) since your procedure? no  3.   Have you tested positive for COVID 19 since your procedure no  4.   Have you had any family members/close contacts diagnosed with the COVID 19 since your procedure?  no   If yes to any of these questions please route to Joylene John, RN and Joella Prince, RN

## 2021-06-13 DIAGNOSIS — Z6824 Body mass index (BMI) 24.0-24.9, adult: Secondary | ICD-10-CM | POA: Diagnosis not present

## 2021-06-13 DIAGNOSIS — Z124 Encounter for screening for malignant neoplasm of cervix: Secondary | ICD-10-CM | POA: Diagnosis not present

## 2021-06-13 DIAGNOSIS — Z1231 Encounter for screening mammogram for malignant neoplasm of breast: Secondary | ICD-10-CM | POA: Diagnosis not present

## 2021-06-13 DIAGNOSIS — Z3041 Encounter for surveillance of contraceptive pills: Secondary | ICD-10-CM | POA: Diagnosis not present

## 2021-06-13 DIAGNOSIS — Z01419 Encounter for gynecological examination (general) (routine) without abnormal findings: Secondary | ICD-10-CM | POA: Diagnosis not present

## 2021-08-17 DIAGNOSIS — Z0000 Encounter for general adult medical examination without abnormal findings: Secondary | ICD-10-CM | POA: Diagnosis not present

## 2021-08-17 DIAGNOSIS — Z23 Encounter for immunization: Secondary | ICD-10-CM | POA: Diagnosis not present

## 2021-08-17 DIAGNOSIS — Z13220 Encounter for screening for lipoid disorders: Secondary | ICD-10-CM | POA: Diagnosis not present

## 2021-08-17 DIAGNOSIS — K5180 Other ulcerative colitis without complications: Secondary | ICD-10-CM | POA: Diagnosis not present

## 2021-08-17 DIAGNOSIS — E559 Vitamin D deficiency, unspecified: Secondary | ICD-10-CM | POA: Diagnosis not present

## 2022-01-01 ENCOUNTER — Other Ambulatory Visit: Payer: Self-pay | Admitting: Gastroenterology

## 2022-01-24 ENCOUNTER — Ambulatory Visit (INDEPENDENT_AMBULATORY_CARE_PROVIDER_SITE_OTHER): Payer: BC Managed Care – PPO | Admitting: Physician Assistant

## 2022-01-24 ENCOUNTER — Encounter: Payer: Self-pay | Admitting: Physician Assistant

## 2022-01-24 ENCOUNTER — Other Ambulatory Visit (INDEPENDENT_AMBULATORY_CARE_PROVIDER_SITE_OTHER): Payer: BC Managed Care – PPO

## 2022-01-24 VITALS — BP 98/60 | HR 74 | Ht 67.0 in | Wt 154.0 lb

## 2022-01-24 DIAGNOSIS — K5190 Ulcerative colitis, unspecified, without complications: Secondary | ICD-10-CM

## 2022-01-24 DIAGNOSIS — R1013 Epigastric pain: Secondary | ICD-10-CM | POA: Diagnosis not present

## 2022-01-24 LAB — CBC WITH DIFFERENTIAL/PLATELET
Basophils Absolute: 0 10*3/uL (ref 0.0–0.1)
Basophils Relative: 0.5 % (ref 0.0–3.0)
Eosinophils Absolute: 0.1 10*3/uL (ref 0.0–0.7)
Eosinophils Relative: 1.7 % (ref 0.0–5.0)
HCT: 37.8 % (ref 36.0–46.0)
Hemoglobin: 12.6 g/dL (ref 12.0–15.0)
Lymphocytes Relative: 33.1 % (ref 12.0–46.0)
Lymphs Abs: 2.7 10*3/uL (ref 0.7–4.0)
MCHC: 33.5 g/dL (ref 30.0–36.0)
MCV: 86.3 fl (ref 78.0–100.0)
Monocytes Absolute: 0.8 10*3/uL (ref 0.1–1.0)
Monocytes Relative: 9.8 % (ref 3.0–12.0)
Neutro Abs: 4.5 10*3/uL (ref 1.4–7.7)
Neutrophils Relative %: 54.9 % (ref 43.0–77.0)
Platelets: 256 10*3/uL (ref 150.0–400.0)
RBC: 4.38 Mil/uL (ref 3.87–5.11)
RDW: 13.7 % (ref 11.5–15.5)
WBC: 8.1 10*3/uL (ref 4.0–10.5)

## 2022-01-24 LAB — LIPASE: Lipase: 22 U/L (ref 11.0–59.0)

## 2022-01-24 LAB — COMPREHENSIVE METABOLIC PANEL
ALT: 7 U/L (ref 0–35)
AST: 11 U/L (ref 0–37)
Albumin: 4.3 g/dL (ref 3.5–5.2)
Alkaline Phosphatase: 37 U/L — ABNORMAL LOW (ref 39–117)
BUN: 16 mg/dL (ref 6–23)
CO2: 27 mEq/L (ref 19–32)
Calcium: 9.2 mg/dL (ref 8.4–10.5)
Chloride: 104 mEq/L (ref 96–112)
Creatinine, Ser: 0.76 mg/dL (ref 0.40–1.20)
GFR: 97.31 mL/min (ref >60.00)
Glucose, Bld: 90 mg/dL (ref 70–99)
Potassium: 3.7 mEq/L (ref 3.5–5.1)
Sodium: 137 mEq/L (ref 135–145)
Total Bilirubin: 0.5 mg/dL (ref 0.2–1.2)
Total Protein: 6.8 g/dL (ref 6.0–8.3)

## 2022-01-24 NOTE — Progress Notes (Signed)
Agree with assessment and plan as outlined.  

## 2022-01-24 NOTE — Patient Instructions (Addendum)
If you are age 42 or younger, your body mass index should be between 19-25. Your Body mass index is 24.12 kg/m?Marland Kitchen If this is out of the aformentioned range listed, please consider follow up with your Primary Care Provider.  ?________________________________________________________ ? ?The [facility] providers would like to encourage you to use [facility] to communicate with providers for non-urgent requests or questions.  Due to long hold times on the telephone, sending your provider a message by [facility] may be a faster and more efficient way to get a response.  Please allow 48 business hours for a response.  Please remember that this is for non-urgent requests.  ?_______________________________________________________ ? ?Your provider has requested that you go to the basement level for lab work before leaving today. Press "B" on the elevator. The lab is located at the first door on the left as you exit the elevator. ? ?You have been scheduled for an abdominal ultrasound at [facility] (1st floor of hospital) on 01/26/2022 at 9:00 am. Please arrive 15 minutes prior to your appointment for registration. Make certain not to have anything to eat or drink 6 hours prior to your appointment. Should you need to reschedule your appointment, please contact radiology at [phone]. This test typically takes about 30 minutes to perform. ? ?Avoid heavy, greasy, fatty foods. ? ?Thank you for entrusting me with your care and choosing [facility]. ? ?Nicoletta Ba, PA-C ?

## 2022-01-24 NOTE — Progress Notes (Signed)
? ?Subjective:  ? ? Patient ID: Barbara Hall, female    DOB: 1981/03/19, 41 y.o.   MRN: [id number] ? ?HPI ?Barbara Hall is a pleasant 41 year old white female, established with Dr. Havery Moros.  She has diagnosis of left-sided ulcerative colitis.  She last underwent colonoscopy in May 2022 with finding of mild inflammation in the cecum, small nonbleeding AVM in the transverse colon, one 10 mm polyp was removed from the ascending colon and noted to have very mild inflammation in the distal rectum. ?Path from the cecum showed no active inflammation, polyp was a sessile serrated polyp without dysplasia, biopsies from the left colon showed no active inflammation and biopsies from the rectum showed enlarged lymphoid aggregates but no active inflammation.  She is indicated for 2-year interval follow-up. ?She says she has done well on Lialda 2.4 g daily, Canasa suppository every other day at bedtime. ? ?She comes in today after an acute episode of severe epigastric pain which occurred 1 week ago after eating a dinner of quesadillas.  She said she developed pain with a lot of belching and burping, then had radiation of pain into her back.  This episode lasted for several hours, she eventually vomited about 1:30 AM and then gradually started feeling better and by the following morning the pain had subsided. ?She says that she has been having a mild "spasmy" discomfort in the epigastrium intermittently for couple of weeks prior to the intense episode, and has continued to have that sensation since.  She has not had any recurrence of any significant pain, no nausea or vomiting.  She has noted some increased frequency of bowel movements, and says that she had a low-grade temp in the 99 range off and on over the past week. ?She has been eating very bland and has not noticed any increase in abdominal discomfort postprandially. ? ?No recent abdominal imaging. ? ?Review of Systems.Pertinent positive and negative review of systems were  noted in the above HPI section.  All other review of systems was otherwise negative.  ? ?Outpatient Encounter Medications as of 01/24/2022  ?Medication Sig  ? dicyclomine (BENTYL) 20 MG tablet Take 1 tablet (20 mg total) by mouth every 8 (eight) hours as needed for spasms.  ? mesalamine (CANASA) 1000 MG suppository Place 1 suppository (1,000 mg total) rectally at bedtime.  ? mesalamine (LIALDA) 1.2 g EC tablet Take 2 tablets (2.4 g total) by mouth daily. Please schedule an office visit for further refills. Thank you  ? [DISCONTINUED] TRI-PREVIFEM 0.18/0.215/0.25 MG-35 MCG tablet Take 1 tablet by mouth daily.  ? ?No facility-administered encounter medications on file as of 01/24/2022.  ? ?No Known Allergies ?Patient Active Problem List  ? Diagnosis Date Noted  ? Left sided colitis ([place]) 12/12/2011  ? Postpartum examination following vaginal delivery (11/9) 09/09/2011  ? ?Social History  ? ?Socioeconomic History  ? Marital status: Married  ?  Spouse name: Not on file  ? Number of children: 2  ? Years of education: Not on file  ? Highest education level: Not on file  ?Occupational History  ? Occupation: Customer service manager  ?Tobacco Use  ? Smoking status: Never  ? Smokeless tobacco: Never  ?Vaping Use  ? Vaping Use: Never used  ?Substance and Sexual Activity  ? Alcohol use: No  ? Drug use: No  ? Sexual activity: Not on file  ?Other Topics Concern  ? Not on file  ?Social History Narrative  ? Daily caffeine   ? ?Social Determinants of Health  ? ?  Financial Resource Strain: Not on file  ?Food Insecurity: Not on file  ?Transportation Needs: Not on file  ?Physical Activity: Not on file  ?Stress: Not on file  ?Social Connections: Not on file  ?Intimate Partner Violence: Not on file  ? ? ?Ms. Carapia's family history includes Colon cancer in her maternal grandmother; Colon polyps in her mother. ? ? ?   ?Objective:  ?  ?Vitals:  ? 01/24/22 1500  ?BP: 98/60  ?Pulse: 74  ? ? ?Physical Exam Well-developed well-nourished white female in  no acute distress.   Weight, 154 BMI 24.1 ? ?HEENT; nontraumatic normocephalic, EOMI, PE R LA, sclera anicteric. ?Oropharynx; not examined today ?Neck; supple, no JVD ?Cardiovascular; regular rate and rhythm with S1-S2, no murmur rub or gallop ?Pulmonary; Clear bilaterally ?Abdomen; soft, minimally tender in the epigastrium, nondistended, no palpable mass or hepatosplenomegaly, bowel sounds are active ?Rectal; not done today ?Skin; benign exam, no jaundice rash or appreciable lesions ?Extremities; no clubbing cyanosis or edema skin warm and dry ?Neuro/Psych; alert and oriented x4, grossly nonfocal mood and affect appropriate  ? ? ? ?   ?Assessment & Plan:  ? ?#41 41 year old white female presenting after an episode 1 week ago of severe epigastric pain radiating through to the back, associated with nausea and vomiting after several hours of pain. ?No recurrence of severe pain but has been noticing a mild spasmy type discomfort in the epigastrium intermittently for couple of weeks prior to the intense episode and also since then. ? ?Etiology of pain is not clear, I am suspicious of biliary colic/gallbladder disease.  Consider gastropathy, peptic ulcer disease. ?Symptoms are not consistent with colonic spasm ? ?#2 history of left-sided ulcerative colitis-quiescent with no active inflammation noted on path at colonoscopy May 2022 ? 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 ?Plan; continue bland diet, avoid heavy greasy/fatty foods ?CBC with differential, c-Met, lipase ?Patient will be scheduled for upper abdominal ultrasound ?She is advised to call should she have any recurrent symptoms in the interim. ?Continue Lialda 2.4 g daily ?Continue Canasa suppositories every other day. ?Further recommendations pending results of labs and ultrasound ? ?Alejandra Barna S Jazon Jipson PA-C ?01/24/2022 ? ? ?Cc: Alroy Dust, L.Marlou Sa, MD ?  ?

## 2022-01-25 ENCOUNTER — Other Ambulatory Visit: Payer: Self-pay | Admitting: Gastroenterology

## 2022-01-26 ENCOUNTER — Ambulatory Visit (HOSPITAL_COMMUNITY)
Admission: RE | Admit: 2022-01-26 | Discharge: 2022-01-26 | Disposition: A | Discharge status: Home / Self Care | Payer: BC Managed Care – PPO | Source: Ambulatory Visit | Attending: Physician Assistant | Admitting: Physician Assistant

## 2022-01-26 DIAGNOSIS — K5190 Ulcerative colitis, unspecified, without complications: Secondary | ICD-10-CM | POA: Diagnosis not present

## 2022-01-26 DIAGNOSIS — R1013 Epigastric pain: Secondary | ICD-10-CM | POA: Insufficient documentation

## 2022-01-26 DIAGNOSIS — K820 Obstruction of gallbladder: Secondary | ICD-10-CM | POA: Diagnosis not present

## 2022-01-30 ENCOUNTER — Telehealth: Payer: Self-pay | Admitting: Gastroenterology

## 2022-01-30 NOTE — Telephone Encounter (Signed)
Patient is returning your call.  

## 2022-01-31 ENCOUNTER — Other Ambulatory Visit: Payer: Self-pay

## 2022-01-31 DIAGNOSIS — R1013 Epigastric pain: Secondary | ICD-10-CM

## 2022-01-31 NOTE — Telephone Encounter (Signed)
Beth this must be for you  ?

## 2022-01-31 NOTE — Telephone Encounter (Signed)
Patient notified of the plan to have an EGD if the HIDA scan is negative per Nicoletta Ba, PA. ?Scheduled in the first opening 03/22/22 at 8:00 am. She will need instructions mailed to her. She is aware she will arrive at 7:00 am and must fast after 4 am.  ?

## 2022-01-31 NOTE — Telephone Encounter (Signed)
See patient's recent u/s report. Discussed results. Scheduled for CCK 02/08/22 at 9:00 am. ?

## 2022-02-03 ENCOUNTER — Other Ambulatory Visit: Payer: Self-pay | Admitting: Gastroenterology

## 2022-02-08 ENCOUNTER — Ambulatory Visit (HOSPITAL_COMMUNITY)
Admission: RE | Admit: 2022-02-08 | Discharge: 2022-02-08 | Disposition: A | Discharge status: Home / Self Care | Payer: BC Managed Care – PPO | Source: Ambulatory Visit | Attending: Physician Assistant | Admitting: Physician Assistant

## 2022-02-08 ENCOUNTER — Other Ambulatory Visit: Payer: Self-pay

## 2022-02-08 DIAGNOSIS — R1013 Epigastric pain: Secondary | ICD-10-CM | POA: Diagnosis not present

## 2022-02-08 DIAGNOSIS — R109 Unspecified abdominal pain: Secondary | ICD-10-CM | POA: Diagnosis not present

## 2022-02-08 LAB — HCG, SERUM, QUALITATIVE: Preg, Serum: NEGATIVE

## 2022-02-08 MED ORDER — MORPHINE SULFATE (PF) 2 MG/ML IV SOLN
2.7000 mg | Freq: Once | INTRAVENOUS | Status: AC
  Administered 2022-02-08: 2.7 mg via INTRAVENOUS

## 2022-02-08 MED ORDER — PANTOPRAZOLE SODIUM 40 MG PO TBEC: DELAYED_RELEASE_TABLET | ORAL | 2 refills | Status: DC

## 2022-02-08 MED ORDER — TECHNETIUM TC 99M MEBROFENIN IV KIT
5.4500 | PACK | Freq: Once | INTRAVENOUS | Status: AC | PRN
  Administered 2022-02-08: 5.45 via INTRAVENOUS

## 2022-02-08 MED ORDER — MORPHINE SULFATE (PF) 4 MG/ML IV SOLN
INTRAVENOUS | Status: AC
  Filled 2022-02-08: qty 1

## 2022-02-16 ENCOUNTER — Telehealth: Payer: Self-pay

## 2022-02-16 NOTE — Telephone Encounter (Signed)
Prior Authorization has been started for patient's Pantoprazole ?

## 2022-03-06 DIAGNOSIS — K219 Gastro-esophageal reflux disease without esophagitis: Secondary | ICD-10-CM | POA: Insufficient documentation

## 2022-03-06 NOTE — Telephone Encounter (Signed)
Prior Authorization has been re-submitted. ?

## 2022-03-09 NOTE — Telephone Encounter (Signed)
Received fax that the patient's insurance [employer] does not cover medications ?

## 2022-03-09 NOTE — Telephone Encounter (Signed)
Left voicemail to return phone call. Trying to find out if she has already picked up medication, or who does covers her medication. ?

## 2022-03-20 ENCOUNTER — Telehealth: Payer: Self-pay

## 2022-03-20 NOTE — Telephone Encounter (Signed)
Pt called stating that she did not receive any prep instructions for her EGD scheduled for 03/22/22 with Dr. Havery Moros.  Pt was given verbal instructions over the phone and instruction letter sent via [facility].

## 2022-03-22 ENCOUNTER — Ambulatory Visit (AMBULATORY_SURGERY_CENTER): Payer: Managed Care, Other (non HMO) | Admitting: Gastroenterology

## 2022-03-22 ENCOUNTER — Encounter: Payer: Self-pay | Admitting: Gastroenterology

## 2022-03-22 VITALS — BP 107/70 | HR 79 | Temp 98.2°F | Resp 10 | Ht 67.0 in | Wt 154.0 lb

## 2022-03-22 DIAGNOSIS — R1013 Epigastric pain: Secondary | ICD-10-CM

## 2022-03-22 DIAGNOSIS — K317 Polyp of stomach and duodenum: Secondary | ICD-10-CM

## 2022-03-22 DIAGNOSIS — K319 Disease of stomach and duodenum, unspecified: Secondary | ICD-10-CM | POA: Diagnosis not present

## 2022-03-22 DIAGNOSIS — K449 Diaphragmatic hernia without obstruction or gangrene: Secondary | ICD-10-CM | POA: Diagnosis not present

## 2022-03-22 DIAGNOSIS — K2970 Gastritis, unspecified, without bleeding: Secondary | ICD-10-CM | POA: Diagnosis not present

## 2022-03-22 MED ORDER — OMEPRAZOLE 40 MG PO CPDR: 40.0000 mg | DELAYED_RELEASE_CAPSULE | Freq: Every day | ORAL | 0 refills | Status: DC

## 2022-03-22 MED ORDER — SODIUM CHLORIDE 0.9 % IV SOLN: 500.0000 mL | Freq: Once | INTRAVENOUS | Status: DC

## 2022-03-22 NOTE — Progress Notes (Signed)
Called to room to assist during endoscopic procedure.  Patient ID and intended procedure confirmed with present staff. Received instructions for my participation in the procedure from the performing physician.  

## 2022-03-22 NOTE — Progress Notes (Signed)
[facility] History and Physical   Primary Care Physician:  Mckinley Jewel, MD   Reason for Procedure:   Epigastric pain  Plan:    EGD     HPI: Barbara Hall is a 41 y.o. female  here for EGD to evaluate intermittent epigastric pain associated with nausea and rare vomiting. Gallbladder worked up recently and negative. Patient denies any bowel symptoms at this time. Otherwise feels well without any cardiopulmonary symptoms. I have discussed risks / benefits of EGD and anesthesia and she wants to proceed.   Past Medical History:  Diagnosis Date   Anemia    Colitis, chronic, ulcerative ([place])    Hemorrhoids    Mastitis    Postpartum examination following vaginal delivery (11/9) 09/09/2011    Past Surgical History:  Procedure Laterality Date   BARTHOLIN GLAND CYST EXCISION  2006    Prior to Admission medications   Medication Sig Start Date End Date Taking? Authorizing Provider  mesalamine (CANASA) 1000 MG suppository Place 1 suppository (1,000 mg total) rectally at bedtime. 12/28/20  Yes Loula Marcella, Carlota Raspberry, MD  mesalamine (LIALDA) 1.2 g EC tablet Take 2 tablets (2.4 g total) by mouth in the morning and at bedtime. 01/25/22  Yes Karah Caruthers, Carlota Raspberry, MD  dicyclomine (BENTYL) 20 MG tablet Take 1 tablet (20 mg total) by mouth every 8 (eight) hours as needed for spasms. 04/03/18   Kelden Lavallee, Carlota Raspberry, MD    Current Outpatient Medications  Medication Sig Dispense Refill   mesalamine (CANASA) 1000 MG suppository Place 1 suppository (1,000 mg total) rectally at bedtime. 90 suppository 3   mesalamine (LIALDA) 1.2 g EC tablet Take 2 tablets (2.4 g total) by mouth in the morning and at bedtime. 60 tablet 1   dicyclomine (BENTYL) 20 MG tablet Take 1 tablet (20 mg total) by mouth every 8 (eight) hours as needed for spasms. 90 tablet 2   Current Facility-Administered Medications  Medication Dose Route Frequency Provider Last Rate Last Admin   0.9 %  sodium chloride infusion   500 mL Intravenous Once Jamaya Sleeth, Carlota Raspberry, MD        Allergies as of 03/22/2022   (No Known Allergies)    Family History  Problem Relation Age of Onset   Colon polyps Mother    Colon cancer Maternal Grandmother        15 yrs ago dx (1998)   Esophageal cancer Neg Hx    Rectal cancer Neg Hx    Stomach cancer Neg Hx     Social History   Socioeconomic History   Marital status: Married    Spouse name: Not on file   Number of children: 2   Years of education: Not on file   Highest education level: Not on file  Occupational History   Occupation: Customer service manager  Tobacco Use   Smoking status: Never   Smokeless tobacco: Never  Vaping Use   Vaping Use: Never used  Substance and Sexual Activity   Alcohol use: No   Drug use: No   Sexual activity: Not on file  Other Topics Concern   Not on file  Social History Narrative   Daily caffeine    Social Determinants of Health   Financial Resource Strain: Not on file  Food Insecurity: Not on file  Transportation Needs: Not on file  Physical Activity: Not on file  Stress: Not on file  Social Connections: Not on file  Intimate Partner Violence: Not on file    Review of Systems:  All other review of systems negative except as mentioned in the HPI.  Physical Exam: Vital signs BP 118/72   Pulse 77   Temp 98.2 F (36.8 C) (Temporal)   Ht 5' 7"  (1.702 m)   Wt 154 lb (69.9 kg)   LMP 03/13/2022 (Exact Date)   SpO2 99%   BMI 24.12 kg/m   General:   Alert,  Well-developed, pleasant and cooperative in NAD Lungs:  Clear throughout to auscultation.   Heart:  Regular rate and rhythm Abdomen:  Soft, nontender and nondistended.   Neuro/Psych:  Alert and cooperative. Normal mood and affect. A and O x 3  Jolly Mango, MD [facility]

## 2022-03-22 NOTE — Op Note (Signed)
[facility] Patient Name: Barbara Hall Procedure Date: 03/22/2022 7:32 AM MRN: [id number] Endoscopist: Remo Lipps P. Havery Moros , MD Age: 41 Referring MD:  Date of Birth: 1981/03/10 Gender: Female Account #: [id number] Procedure:                Upper GI endoscopy Indications:              Epigastric abdominal pain - [place] and HIDA without                            clear cause - symptoms not c/w typical biliary                            colic. Has not yet done trial of PPI Medicines:                Monitored Anesthesia Care Procedure:                Pre-Anesthesia Assessment:                           - Prior to the procedure, a History and Physical                            was performed, and patient medications and                            allergies were reviewed. The patient's tolerance of                            previous anesthesia was also reviewed. The risks                            and benefits of the procedure and the sedation                            options and risks were discussed with the patient.                            All questions were answered, and informed consent                            was obtained. Prior Anticoagulants: The patient has                            taken no previous anticoagulant or antiplatelet                            agents. ASA Grade Assessment: II - A patient with                            mild systemic disease. After reviewing the risks                            and benefits, the patient was deemed in  satisfactory condition to undergo the procedure.                           After obtaining informed consent, the endoscope was                            passed under direct vision. Throughout the                            procedure, the patient's blood pressure, pulse, and                            oxygen saturations were monitored continuously. The                            GIF [id number]  #[id number] was introduced through the                            mouth, and advanced to the second part of duodenum.                            The upper GI endoscopy was accomplished without                            difficulty. The patient tolerated the procedure                            well. Scope In: Scope Out: Findings:                 Esophagogastric landmarks were identified: the                            Z-line was found at 39 cm, the gastroesophageal                            junction was found at 39 cm and the upper extent of                            the gastric folds was found at 40 cm from the                            incisors.                           A 1 cm hiatal hernia was present.                           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                           The exam of the  esophagus was otherwise normal.                           A few small sessile polyps were found in the                            gastric body, benign appearing. One representative                            polyp was removed with a cold biopsy forceps.                            Resection and retrieval were complete.                           The exam of the stomach was otherwise normal.                           Biopsies were taken with a cold forceps in the                            gastric body, at the incisura and in the gastric                            antrum for Helicobacter pylori testing.                           The duodenal bulb and second portion of the                            duodenum were normal. Complications:            No immediate complications. Estimated blood loss:                            Minimal. Estimated Blood Loss:     Estimated blood loss was minimal. Impression:               - Esophagogastric landmarks identified.                           - 1 cm hiatal hernia.                            - Ectopic gastric mucosa in the upper third of the                            esophagus.                           - Normal esophagus otherwise.                           - A few gastric polyps. Representative sample  resected and retrieved.                           - Normal stomach otherwise - biopsies taken to rule                            out H pylori                           - Normal duodenal bulb and second portion of the                            duodenum.                           No overt cause of symptoms on EGD. Trial of PPI                            reasonable to treat any component of dyspepsia. If                            no improvement would then consider cross sectional                            imaging. Recommendation:           - Patient has a contact number available for                            emergencies. The signs and symptoms of potential                            delayed complications were discussed with the                            patient. Return to normal activities tomorrow.                            Written discharge instructions were provided to the                            patient.                           - Resume previous diet.                           - Continue present medications.                           - Trial of protonix 54m / day or omeprazole 462m/                            day for 4 weeks and see if any improvement                           -  Await pathology results.                           - Consideration for cross sectional imaging (CT),                            if symptoms persist Rameses Ou P. Nick Stults, MD 03/22/2022 8:41:45 AM This report has been signed electronically.

## 2022-03-22 NOTE — Progress Notes (Signed)
Pt non-responsive, VVS, Report to RN  °

## 2022-03-22 NOTE — Patient Instructions (Addendum)
Handout on Hiatal hernia given.  Await pathology results.  Trial of Omeprazole 48m daily for 4 weeks.  If symptoms persist, consider CT.  YOU HAD AN ENDOSCOPIC PROCEDURE TODAY AT [facility]   Refer to the procedure report that was given to you for any specific questions about what was found during the examination.  If the procedure report does not answer your questions, please call your gastroenterologist to clarify.  If you requested that your care partner not be given the details of your procedure findings, then the procedure report has been included in a sealed envelope for you to review at your convenience later.  YOU SHOULD EXPECT: Some feelings of bloating in the abdomen. Passage of more gas than usual.  Walking can help get rid of the air that was put into your GI tract during the procedure and reduce the bloating. If you had a lower endoscopy (such as a colonoscopy or flexible sigmoidoscopy) you may notice spotting of blood in your stool or on the toilet paper. If you underwent a bowel prep for your procedure, you may not have a normal bowel movement for a few days.  Please Note:  You might notice some irritation and congestion in your nose or some drainage.  This is from the oxygen used during your procedure.  There is no need for concern and it should clear up in a day or so.  SYMPTOMS TO REPORT IMMEDIATELY:   Following upper endoscopy (EGD)  Vomiting of blood or coffee ground material  New chest pain or pain under the shoulder blades  Painful or persistently difficult swallowing  New shortness of breath  Fever of 100F or higher  Black, tarry-looking stools  For urgent or emergent issues, a gastroenterologist can be reached at any hour by calling [phone] Do not use MyChart messaging for urgent concerns.    DIET:  We do recommend a small meal at first, but then you may proceed to your regular diet.  Drink plenty of fluids but you should avoid alcoholic  beverages for 24 hours.  ACTIVITY:  You should plan to take it easy for the rest of today and you should NOT DRIVE or use heavy machinery until tomorrow (because of the sedation medicines used during the test).    FOLLOW UP: Our staff will call the number listed on your records 48-72 hours following your procedure to check on you and address any questions or concerns that you may have regarding the information given to you following your procedure. If we do not reach you, we will leave a message.  We will attempt to reach you two times.  During this call, we will ask if you have developed any symptoms of COVID 19. If you develop any symptoms (ie: fever, flu-like symptoms, shortness of breath, cough etc.) before then, please call [phone]  If you test positive for Covid 19 in the 2 weeks post procedure, please call and report this information to [facility]    If any biopsies were taken you will be contacted by phone or by letter within the next 1-3 weeks.  Please call [facility] ([phone] you have not heard about the biopsies in 3 weeks.    SIGNATURES/CONFIDENTIALITY: You and/or your care partner have signed paperwork which will be entered into your electronic medical record.  These signatures attest to the fact that that the information above on your After Visit Summary has been reviewed and is understood.  Full responsibility of the  confidentiality of this discharge information lies with you and/or your care-partner.

## 2022-03-23 ENCOUNTER — Telehealth: Payer: Self-pay

## 2022-03-23 NOTE — Telephone Encounter (Signed)
  Follow up Call-     03/22/2022    7:19 AM 03/02/2021    9:02 AM  Call back number  Post procedure Call Back phone  # [phone]  Permission to leave phone message Yes Yes     Patient questions:  Do you have a fever, pain , or abdominal swelling? No. Pain Score  0 *  Have you tolerated food without any problems? Yes.    Have you been able to return to your normal activities? Yes.    Do you have any questions about your discharge instructions: Diet   No. Medications  No. Follow up visit  No.  Do you have questions or concerns about your Care? No.  Actions: * If pain score is 4 or above: No action needed, pain <4.

## 2022-03-28 ENCOUNTER — Other Ambulatory Visit (HOSPITAL_COMMUNITY): Payer: Self-pay

## 2022-03-29 ENCOUNTER — Ambulatory Visit (INDEPENDENT_AMBULATORY_CARE_PROVIDER_SITE_OTHER): Payer: Managed Care, Other (non HMO) | Admitting: Gastroenterology

## 2022-03-29 ENCOUNTER — Encounter: Payer: Self-pay | Admitting: Gastroenterology

## 2022-03-29 VITALS — BP 104/60 | HR 66 | Ht 67.0 in | Wt 153.0 lb

## 2022-03-29 DIAGNOSIS — R112 Nausea with vomiting, unspecified: Secondary | ICD-10-CM | POA: Diagnosis not present

## 2022-03-29 DIAGNOSIS — K5130 Ulcerative (chronic) rectosigmoiditis without complications: Secondary | ICD-10-CM | POA: Diagnosis not present

## 2022-03-29 DIAGNOSIS — R1010 Upper abdominal pain, unspecified: Secondary | ICD-10-CM

## 2022-03-29 NOTE — Patient Instructions (Addendum)
If you are age 42 or older, your body mass index should be between 23-30. Your Body mass index is 23.96 kg/m. If this is out of the aforementioned range listed, please consider follow up with your Primary Care Provider.  If you are age 70 or younger, your body mass index should be between 19-25. Your Body mass index is 23.96 kg/m. If this is out of the aformentioned range listed, please consider follow up with your Primary Care Provider.   ________________________________________________________  The [facility] providers would like to encourage you to use [facility] to communicate with providers for non-urgent requests or questions.  Due to long hold times on the telephone, sending your provider a message by [facility] may be a faster and more efficient way to get a response.  Please allow 48 business hours for a response.  Please remember that this is for non-urgent requests.  _______________________________________________________  Barbara Hall have been scheduled for a CT scan of the abdomen and pelvis at [facility], 1st floor Radiology. You are scheduled on Wednesday, 04-05-22  at 8:00 am. You should arrive 15 minutes prior to your appointment time for registration. The solution may taste better if refrigerated, but do NOT add ice or any other liquid to this solution. Shake well before drinking.   Please follow the written instructions below on the day of your exam:   1) Do not eat anything after 4:00 am (4 hours prior to your test)   2) Drink 1 bottle of contrast @ 6:00 am (2 hours prior to your exam)  Remember to shake well before drinking and do NOT pour over ice.     Drink 1 bottle of contrast @ 7:00 am (1 hour prior to your exam)   You may take any medications as prescribed with a small amount of water, if necessary. If you take any of the following medications: METFORMIN, GLUCOPHAGE, GLUCOVANCE, AVANDAMET, RIOMET, FORTAMET, [place] MET, JANUMET, GLUMETZA or METAGLIP, you MAY be asked to HOLD  this medication 48 hours AFTER the exam.   The purpose of you drinking the oral contrast is to aid in the visualization of your intestinal tract. The contrast solution may cause some diarrhea. Depending on your individual set of symptoms, you may also receive an intravenous injection of x-ray contrast/dye. Plan on being at [facility] for 45 minutes or longer, depending on the type of exam you are having performed.   If you have any questions regarding your exam or if you need to reschedule, you may call [facility] at [phone] between the hours of 8:00 am and 5:00 pm, Monday-Friday.    Continue omeprazole once daily.  Thank you for entrusting me with your care and for choosing [facility], Dr. [facility]ellar

## 2022-03-29 NOTE — Progress Notes (Signed)
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 Treated with mesalamine over the years. No hospitalizations since diagnosis. Endoscopies have been done at times of flaring and not when feeling well, or when off medications.    She has no FH of Crohns or colitis. On mother's side, grandmother and aunt had colon cancer.    Colonoscopy 03/02/21 - The perianal and digital rectal examinations were normal. - The terminal ileum appeared normal. - Localized mild inflammation characterized by erythema, granularity and loss of vascularity was found in the cecum (cecal patch). Biopsies were taken with a cold forceps for histology. - A single small angiodysplastic lesion was found in the transverse colon. - A 10 mm polyp was found in the ascending colon. The polyp was flat. The polyp was removed with a cold snare. Resection and retrieval were complete. - Very mild inflammation characterized by altered vascularity and granularity was found in the distal rectum. Biopsies were taken with a cold forceps for histology. - The exam was otherwise without abnormality. Generally very good control of colitis on present regimen, inflammation in the rectum is quite mild. - Biopsies were taken with a cold forceps in the rectum, in the sigmoid colon and in the descending colon for histology for surveillance.  Diagnosis 1. Surgical [P], colon, cecum - COLONIC MUCOSA WITH ENLARGED LYMPHOID AGGREGATES. - NO ACTIVE INFLAMMATION OR GRANULOMA. - NEGATIVE FOR DYSPLASIA. 2. Surgical [P], colon, ascending, polyp (1) - SESSILE SERRATED POLYP WITHOUT CYTOLOGIC DYSPLASIA. 3. Surgical [P], colon, random left colon sites - UNREMARKABLE COLONIC MUCOSA. - NO ACTIVE INFLAMMATION OR GRANULOMAS. - NEGATIVE FOR DYSPLASIA. 4. Surgical [P], colon, rectum - COLONIC MUCOSA WITH ENLARGED LYMPHOID AGGREGATES. - NO ACTIVE INFLAMMATION OR GRANULOMAS. - NEGATIVE FOR DYSPLASIA.  Repeat exam in  2 years   Colonoscopy 06/26/2018 - normal TI, 63m flat lesion in hepatic flexure - SSP, mild inflammation in distal rectum, small cecal patch, otherwise normal -  Colonoscopy 2007 - left sided colitis Colonoscopy 2010 - active proctitis only Flex sig 2013 - left sided colitis     SINCE LAST VISIT:  Since have last seen the patient in the office she was seen by ANicoletta Baon March 28. At that time she was endorsing some intermittent epigastric to RUQ pain associate with nausea and vomiting.  On March 31 she had ultrasound of the abdomen which showed a suspected small 5 mm gallbladder polyp for which radiology did not recommend any follow up.  No gallstones noted.  She subsequently had a HIDA scan on April 12, this showed a patent cystic and common bile duct.  We had discussed doing an empiric trial of PPI and then an EGD.  She had not tried the PPI but presented for EGD on May 24.  This did not show any clear cause for her symptoms.  Stomach was normal, biopsies negative for H. pylori.  A few benign-appearing gastric polyps.  Again advised to try a course of PPI and if did not help we would consider cross-sectional imaging. Labs on March 28 showed normal CBC, c-Met, lipase.  This is my first time seeing her in the office for these particular symptoms.  She describes episodic pain in her right upper quadrant to epigastric area that is not brought on by any specific clear triggers.  The pain can radiate into her back at times, does not go into her shoulder.  This can often happen when she is asleep and wakes  her at night.  She will feel nauseated and starts belching, she feels that there is a "balloon swelling in my abdomen", that can cause severe discomfort and can lead to vomiting.  Episodes can last anywhere from 30 minutes to 6 hours, usually they are typically a few hours in length.  She had 4 episodes in April and 2 in May.  Her last episode was May 15.  She does not endorse any eating prior to  episodes.  She denies any heartburn otherwise.  She denies any fevers with these episodes.  She denies any bowel changes otherwise.  She is been doing pretty well on Lialda daily and Canasa every other day in regards to her colitis.  After here EGD she has been compliant with her omeprazole once daily.  She has not had an episode since May 15.  She never really has had episodes like this before March timeframe.  She denies any history of bowel obstructions or abdominal surgeries.     EGD 03/22/22:Esophagogastric landmarks identified. - 1 cm hiatal hernia. - Ectopic gastric mucosa in the upper third of the esophagus. - Normal esophagus otherwise. - A few gastric polyps. Representative sample resected and retrieved. - Normal stomach otherwise - biopsies taken to rule out H pylori - Normal duodenal bulb and second portion of the duodenum. No overt cause of symptoms on EGD. Trial of PPI reasonable to treat any component of dyspepsia. If no improvement would then consider cross sectional imaging.  Diagnosis 1. Surgical [P], gastric antrum and gastric body REACTIVE GASTROPATHY NEGATIVE FOR H. PYLORI, INTESTINAL METAPLASIA, DYSPLASIA AND CARCINOMA 2. Surgical [P], gastric polyp, polyp (1) POLYPOID FRAGMENT OF FUNDIC TYPE GASTRIC MUCOSA SHOWING MINIMAL CHRONIC GASTRITIS WITH REACTIVE EPITHELIAL CHANGES AND A LYMPHOID AGGREGATE NEGATIVE FOR H. PYLORI, INTESTINAL METAPLASIA, DYSPLASIA AND CARCINOMA   Past Medical History:  Diagnosis Date   Anemia    Colitis, chronic, ulcerative ([place])    Hemorrhoids    Mastitis    Postpartum examination following vaginal delivery (11/9) 09/09/2011     Past Surgical History:  Procedure Laterality Date   BARTHOLIN GLAND CYST EXCISION  2006   Family History  Problem Relation Age of Onset   Colon polyps Mother    Colon cancer Maternal Grandmother        15 yrs ago dx (1998)   Esophageal cancer Neg Hx    Rectal cancer Neg Hx    Stomach cancer Neg Hx     Social History   Tobacco Use   Smoking status: Never   Smokeless tobacco: Never  Vaping Use   Vaping Use: Never used  Substance Use Topics   Alcohol use: No   Drug use: No   Current Outpatient Medications  Medication Sig Dispense Refill   dicyclomine (BENTYL) 20 MG tablet Take 1 tablet (20 mg total) by mouth every 8 (eight) hours as needed for spasms. 90 tablet 2   mesalamine (CANASA) 1000 MG suppository Place 1 suppository (1,000 mg total) rectally at bedtime. 90 suppository 3   mesalamine (LIALDA) 1.2 g EC tablet Take 2 tablets (2.4 g total) by mouth in the morning and at bedtime. 60 tablet 1   omeprazole (PRILOSEC) 40 MG capsule Take 1 capsule (40 mg total) by mouth daily. Take for 4 weeks and see if any improvement 30 capsule 0   No current facility-administered medications for this visit.   No Known Allergies   Review of Systems: All systems reviewed and negative except where noted in HPI.  Lab Results  Component Value Date   WBC 8.1 01/24/2022   HGB 12.6 01/24/2022   HCT 37.8 01/24/2022   MCV 86.3 01/24/2022   PLT 256.0 01/24/2022   Lab Results  Component Value Date   NA 137 01/24/2022   K 3.7 01/24/2022   CO2 27 01/24/2022   GLUCOSE 90 01/24/2022   BUN 16 01/24/2022   CREATININE 0.76 01/24/2022   CALCIUM 9.2 01/24/2022    Lab Results  Component Value Date   ALT 7 01/24/2022   AST 11 01/24/2022   ALKPHOS 37 (L) 01/24/2022   BILITOT 0.5 01/24/2022     Physical Exam: BP 104/60   Pulse 66   Ht 5' 7"  (1.702 m)   Wt 153 lb (69.4 kg)   LMP 03/13/2022 (Exact Date)   BMI 23.96 kg/m  Constitutional: Pleasant,well-developed, female in no acute distress. Abdominal: Soft, nondistended, nontender.  There are no masses palpable.  Skin: Skin is warm and dry. No rashes noted. Psychiatric: Normal mood and affect. Behavior is normal.   ASSESSMENT AND PLAN: 41 year old female here for reassessment of the following:  Upper abdominal pain Nausea and  vomiting Ulcerative colitis  Colitis has been well controlled on Lialda and Rowasa.  She will continue her regimen for that.  Main issue has been episodic pain as described above that can wake her at night.  There are no clear precipitants for this.  She had her gallbladder evaluated with an ultrasound and HIDA scan which has been negative to date.  We discussed that she does not have typical biliary colic although still possible it could be her gallbladder given location of symptoms with radiation into her back.  We discussed other differential diagnosis for her symptoms, she has never had an obstruction before, prior lipase normal.  Her EGD is reassuring.  We put her on a trial of PPI to treat possible component of dyspepsia however her symptoms are not typical for dyspepsia, unclear if this will make a difference in her course but we will give her a trial of it anyway to see if this will help reduce or minimize her symptoms.  Ultimately I think a CT scan abdomen pelvis is reasonable to make sure there is nothing else going on, make sure pancreas is okay.  If this shows a clear source for her symptoms we will address it.  If this does not show a clear source for her symptoms and they persist over time despite PPI, then may consider surgical evaluation for consideration of empiric cholecystectomy understanding that her symptoms are not typical for biliary colic and that it may not improve her symptoms.  Hopefully that is not needed, will await her course and results of her CT scan.  She will continue omeprazole once daily for now.  She agrees  Jolly Mango, MD [facility]

## 2022-04-05 ENCOUNTER — Ambulatory Visit (HOSPITAL_COMMUNITY)
Admission: RE | Admit: 2022-04-05 | Discharge: 2022-04-05 | Disposition: A | Discharge status: Home / Self Care | Payer: Managed Care, Other (non HMO) | Source: Ambulatory Visit | Attending: Gastroenterology | Admitting: Gastroenterology

## 2022-04-05 ENCOUNTER — Encounter (HOSPITAL_COMMUNITY): Payer: Self-pay

## 2022-04-05 DIAGNOSIS — R112 Nausea with vomiting, unspecified: Secondary | ICD-10-CM | POA: Diagnosis present

## 2022-04-05 DIAGNOSIS — R1010 Upper abdominal pain, unspecified: Secondary | ICD-10-CM | POA: Insufficient documentation

## 2022-04-05 MED ORDER — IOHEXOL 300 MG/ML  SOLN
100.0000 mL | Freq: Once | INTRAMUSCULAR | Status: AC | PRN
  Administered 2022-04-05: 100 mL via INTRAVENOUS

## 2022-04-05 MED ORDER — SODIUM CHLORIDE (PF) 0.9 % IJ SOLN
INTRAMUSCULAR | Status: AC
  Filled 2022-04-05: qty 50

## 2022-04-13 ENCOUNTER — Other Ambulatory Visit: Payer: Self-pay | Admitting: Gastroenterology

## 2022-04-13 DIAGNOSIS — R1013 Epigastric pain: Secondary | ICD-10-CM

## 2022-04-27 ENCOUNTER — Other Ambulatory Visit: Payer: Self-pay | Admitting: Gastroenterology

## 2022-04-27 DIAGNOSIS — R1013 Epigastric pain: Secondary | ICD-10-CM

## 2022-05-05 ENCOUNTER — Other Ambulatory Visit (HOSPITAL_COMMUNITY): Payer: Self-pay

## 2022-05-29 ENCOUNTER — Other Ambulatory Visit (HOSPITAL_COMMUNITY): Payer: Self-pay

## 2022-05-30 ENCOUNTER — Ambulatory Visit (INDEPENDENT_AMBULATORY_CARE_PROVIDER_SITE_OTHER): Payer: Managed Care, Other (non HMO) | Admitting: Gastroenterology

## 2022-05-30 ENCOUNTER — Encounter: Payer: Self-pay | Admitting: Gastroenterology

## 2022-05-30 VITALS — BP 102/60 | HR 91 | Ht 67.0 in | Wt 154.0 lb

## 2022-05-30 DIAGNOSIS — K5130 Ulcerative (chronic) rectosigmoiditis without complications: Secondary | ICD-10-CM

## 2022-05-30 DIAGNOSIS — R1010 Upper abdominal pain, unspecified: Secondary | ICD-10-CM | POA: Diagnosis not present

## 2022-05-30 NOTE — Progress Notes (Signed)
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 Treated with mesalamine over the years. No hospitalizations since diagnosis. Endoscopies have been done at times of flaring and not when feeling well, or when off medications.    She has no FH of Crohns or colitis. On mother's side, grandmother and aunt had colon cancer.     41 year old female here for a follow-up visit.  I last saw her at the end of May of this year.  Recall she has a history of left-sided proctitis/colitis ,had been treated with both oral and rectal mesalamine over the years.  Her last colonoscopy was in May 2022, she had a 1 cm sessile serrated polyp removed, otherwise no inflammation noted on biopsies.  I had thought she had some very mild distal rectal inflammation but biopsies of this area were normal.  Recall at her last visit she had been having some episodic epigastric to right upper quadrant pain. The pain can radiate into her back at times, does not go into her shoulder.  This can often happen when she is asleep and wakes her at night.  She will feel nauseated and starts belching, she feels that there is a "balloon swelling in my abdomen", that can cause severe discomfort and can lead to vomiting.  Episodes can last anywhere from 30 minutes to 6 hours, usually they are typically a few hours in length.  She underwent a work-up to include right upper quadrant ultrasound, HIDA scan, both were negative.  She then had an EGD with me at the end of May which again did not show a clear cause.  Subsequently had a CT scan in June which showed some equivocal changes of the appendix but no clear cause for her symptoms.  In May we had put her on empiric dose of omeprazole 40 mg daily which she states she took for a few weeks.  She has not had an episode of pain since May 15.  She stopped most of her medications including her Lialda during this time.  She stopped some of the supplements she was taking.   Generally she feels pretty well at this point time without any recurrent symptoms.  She is eating well.  No abdominal pains.  No nausea or vomiting.  She has been maintained on just Canasa for her proctitis.  She states she has been off Lialda since June or so.  She has not had any colitis flares and has no problems with her bowels.  No bleeding symptoms.  She wonders if she can maintain on just Canasa monotherapy if she takes it more frequently.   Prior work-up: EGD 03/22/22:Esophagogastric landmarks identified. - 1 cm hiatal hernia. - Ectopic gastric mucosa in the upper third of the esophagus. - Normal esophagus otherwise. - A few gastric polyps. Representative sample resected and retrieved. - Normal stomach otherwise - biopsies taken to rule out H pylori - Normal duodenal bulb and second portion of the duodenum. No overt cause of symptoms on EGD. Trial of PPI reasonable to treat any component of dyspepsia. If no improvement would then consider cross sectional imaging.   1. Surgical [P], gastric antrum and gastric body REACTIVE GASTROPATHY NEGATIVE FOR H. PYLORI, INTESTINAL METAPLASIA, DYSPLASIA AND CARCINOMA 2. Surgical [P], gastric polyp, polyp (1) POLYPOID FRAGMENT OF FUNDIC TYPE GASTRIC MUCOSA SHOWING MINIMAL CHRONIC GASTRITIS WITH REACTIVE EPITHELIAL CHANGES AND A LYMPHOID AGGREGATE NEGATIVE FOR H. PYLORI, INTESTINAL METAPLASIA, DYSPLASIA AND CARCINOMA    Colonoscopy  03/02/21 - The perianal and digital rectal examinations were normal. - The terminal ileum appeared normal. - Localized mild inflammation characterized by erythema, granularity and loss of vascularity was found in the cecum (cecal patch). Biopsies were taken with a cold forceps for histology. - A single small angiodysplastic lesion was found in the transverse colon. - A 10 mm polyp was found in the ascending colon. The polyp was flat. The polyp was removed with a cold snare. Resection and retrieval were complete. -  Very mild inflammation characterized by altered vascularity and granularity was found in the distal rectum. Biopsies were taken with a cold forceps for histology. - The exam was otherwise without abnormality. Generally very good control of colitis on present regimen, inflammation in the rectum is quite mild. - Biopsies were taken with a cold forceps in the rectum, in the sigmoid colon and in the descending colon for histology for surveillance.   1. Surgical [P], colon, cecum - COLONIC MUCOSA WITH ENLARGED LYMPHOID AGGREGATES. - NO ACTIVE INFLAMMATION OR GRANULOMA. - NEGATIVE FOR DYSPLASIA. 2. Surgical [P], colon, ascending, polyp (1) - SESSILE SERRATED POLYP WITHOUT CYTOLOGIC DYSPLASIA. 3. Surgical [P], colon, random left colon sites - UNREMARKABLE COLONIC MUCOSA. - NO ACTIVE INFLAMMATION OR GRANULOMAS. - NEGATIVE FOR DYSPLASIA. 4. Surgical [P], colon, rectum - COLONIC MUCOSA WITH ENLARGED LYMPHOID AGGREGATES. - NO ACTIVE INFLAMMATION OR GRANULOMAS. - NEGATIVE FOR DYSPLASIA.   Repeat exam in 2 years   Colonoscopy 06/26/2018 - normal TI, 46m flat lesion in hepatic flexure - SSP, mild inflammation in distal rectum, small cecal patch, otherwise normal -  Colonoscopy 2007 - left sided colitis Colonoscopy 2010 - active proctitis only Flex sig 2013 - left sided colitis   CT abdomen pelvis with contrast 04/05/22: IMPRESSION: 1. No definite acute findings within the abdomen or pelvis to explain patient's abdominal pain. 2. There are equivocal changes involving the appendix. Specifically, the appendix is upper limits of normal in caliber at 7 mm. Wall thickness of the appendix is also upper limits of normal at 2 mm. The lumen of the proximal and mid appendix is patent containing gas and there is no periappendiceal fat stranding or free fluid.   Past Medical History:  Diagnosis Date   Anemia    Colitis, chronic, ulcerative ([place]    Hemorrhoids    Mastitis    Postpartum examination  following vaginal delivery (11/9) 09/09/2011     Past Surgical History:  Procedure Laterality Date   BARTHOLIN GLAND CYST EXCISION  2006   Family History  Problem Relation Age of Onset   Colon polyps Mother    Colon cancer Maternal Grandmother        15 yrs ago dx (1998)   Esophageal cancer Neg Hx    Rectal cancer Neg Hx    Stomach cancer Neg Hx    Social History   Tobacco Use   Smoking status: Never   Smokeless tobacco: Never  Vaping Use   Vaping Use: Never used  Substance Use Topics   Alcohol use: No   Drug use: No   Current Outpatient Medications  Medication Sig Dispense Refill   dicyclomine (BENTYL) 20 MG tablet Take 1 tablet (20 mg total) by mouth every 8 (eight) hours as needed for spasms. 90 tablet 2   mesalamine (CANASA) 1000 MG suppository Place 1 suppository (1,000 mg total) rectally at bedtime. 90 suppository 3   mesalamine (LIALDA) 1.2 g EC tablet Take 2 tablets (2.4 g total) by mouth in the morning and  at bedtime. 60 tablet 1   VITAMIN D PO Take 2,000 Units by mouth daily.     omeprazole (PRILOSEC) 40 MG capsule TAKE 1 CAPSULE (40 MG TOTAL) BY MOUTH DAILY. TAKE FOR 4 WEEKS AND SEE IF ANY IMPROVEMENT 90 capsule 0   No current facility-administered medications for this visit.   No Known Allergies   Review of Systems: All systems reviewed and negative except where noted in HPI.   Lab Results  Component Value Date   WBC 8.1 01/24/2022   HGB 12.6 01/24/2022   HCT 37.8 01/24/2022   MCV 86.3 01/24/2022   PLT 256.0 01/24/2022    Lab Results  Component Value Date   CREATININE 0.76 01/24/2022   BUN 16 01/24/2022   NA 137 01/24/2022   K 3.7 01/24/2022   CL 104 01/24/2022   CO2 27 01/24/2022    Lab Results  Component Value Date   ALT 7 01/24/2022   AST 11 01/24/2022   ALKPHOS 37 (L) 01/24/2022   BILITOT 0.5 01/24/2022     Physical Exam: BP 102/60   Pulse 91   Ht 5' 7"  (1.702 m)   Wt 154 lb (69.9 kg)   SpO2 99%   BMI 24.12 kg/m   Constitutional: Pleasant,well-developed, female in no acute distress. Neurological: Alert and oriented to person place and time. Psychiatric: Normal mood and affect. Behavior is normal.   ASSESSMENT: 41 y.o. female here for assessment of the following  1. 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 Pain of upper abdomen    As above, she was having episodic pain in her upper abdomen that would radiate to her back without clear precipitants.  She had an ultrasound, HIDA scan which were both negative for gallbladder etiology.  EGD showed no clear cause.  CT scan without clear cause.  She was placed on empiric trial of omeprazole for a few weeks, she has not had any pain since that time.  She has altered her diet and stopped supplement use.  Unclear what was the cause of her symptoms but she has been doing really well for the past 2 and half months.  We will monitor for now.  If she has recurrent episodes asked her to contact me.  We had considered surgical evaluation for empiric cholecystectomy if she continues to have these episodes, hopefully that is not needed.  She has stopped omeprazole, she can try resuming that if symptoms recur.  Otherwise we spent some time discussing her colitis today.  She had been on both oral and rectal mesalamine, she would prefer to hold off on oral Lialda if possible, she has been off it for a few months now.  We discussed goals of therapy are to reduce her symptoms and to reduce her risk for colon cancer.  She had some polyps in the past actually in her right colon, no active inflammation on her last colonoscopy otherwise.  If she is going to hold off on Lialda I recommend she take Canasa every day.  If she has any flares of symptoms that recur she needs to contact us.  I am recommending we do a fecal calprotectin in a few months to assess for objective inflammation.  If this is elevated we may have her resume the Lialda.  Otherwise tentatively planning  for colonoscopy next May 2024.  She can follow-up with me as needed in the interim.   PLAN: - continue Canasa but take every day if she is holding off on Lialda -  call if any recurrent bowel symptoms - fecal calprotectin to be done in 3-4 months - surveillance colonoscopy to be done 02/2023 - call if any recurrent abdominal pain in the interim  Jolly Mango, MD [facility]

## 2022-05-30 NOTE — Patient Instructions (Addendum)
If you are age 42 or older, your body mass index should be between 23-30. Your Body mass index is 24.12 kg/m. If this is out of the aforementioned range listed, please consider follow up with your Primary Care Provider.  If you are age 56 or younger, your body mass index should be between 19-25. Your Body mass index is 24.12 kg/m. If this is out of the aformentioned range listed, please consider follow up with your Primary Care Provider.   ________________________________________________________  The [facility] providers would like to encourage you to use [facility] to communicate with providers for non-urgent requests or questions.  Due to long hold times on the telephone, sending your provider a message by [facility] may be a faster and more efficient way to get a response.  Please allow 48 business hours for a response.  Please remember that this is for non-urgent requests.  _______________________________________________________  Verdene Rio every day.  You will be due for a fecal calprotectin in 3 to 4 months. I will remind you when it is time to go to the lab.  You will be due for a recall colonoscopy in 02-2023. We will send you a reminder in the mail when it gets closer to that time.   Please follow up in 6 months.  Thank you for entrusting me with your care and for choosing [facility], Dr. Parker Cellar

## 2022-08-28 ENCOUNTER — Telehealth: Payer: Self-pay | Admitting: Gastroenterology

## 2022-08-28 MED ORDER — MESALAMINE 1000 MG RE SUPP: 1000.0000 mg | Freq: Every day | RECTAL | 1 refills | Status: AC

## 2022-08-28 MED ORDER — MESALAMINE 1.2 G PO TBEC
2.4000 g | DELAYED_RELEASE_TABLET | Freq: Two times a day (BID) | ORAL | 2 refills | Status: DC
Start: 2022-08-28 — End: 2022-10-17

## 2022-08-28 NOTE — Telephone Encounter (Signed)
Patient is requesting a refill on canasa and lialda, please advise.

## 2022-08-28 NOTE — Telephone Encounter (Signed)
Refill for Canasa and Lialda sent to pharmacy

## 2022-09-05 NOTE — Telephone Encounter (Signed)
-----   Message from Roetta Sessions, [place] sent at 05/30/2022  9:04 AM EDT ----- Regarding: due for fecal calprotectin Patient is due for fecal cal around 11-15. Order is in

## 2022-09-05 NOTE — Telephone Encounter (Signed)
MyChart message sent to patient. Order is in.

## 2022-10-17 ENCOUNTER — Other Ambulatory Visit: Payer: Self-pay | Admitting: Gastroenterology

## 2022-10-31 ENCOUNTER — Other Ambulatory Visit: Payer: Self-pay

## 2022-10-31 ENCOUNTER — Other Ambulatory Visit: Payer: Self-pay | Admitting: Gastroenterology

## 2022-10-31 MED ORDER — MESALAMINE 1.2 G PO TBEC
2.4000 g | DELAYED_RELEASE_TABLET | Freq: Every day | ORAL | 3 refills | Status: AC
Start: 2022-10-31 — End: ?

## 2022-10-31 NOTE — Telephone Encounter (Signed)
2 tabs once daily. 3 month supply with 3 refills is fine. Thanks

## 2022-10-31 NOTE — Telephone Encounter (Signed)
E-script failed twice. Called [facility].  Phoned in order for 2.4g once daily.  Pharmacist changed script and will fill for patient.

## 2022-11-23 ENCOUNTER — Encounter: Payer: Self-pay | Admitting: Gastroenterology

## 2023-01-19 ENCOUNTER — Other Ambulatory Visit: Payer: Self-pay | Admitting: Internal Medicine

## 2023-01-19 DIAGNOSIS — Z1231 Encounter for screening mammogram for malignant neoplasm of breast: Secondary | ICD-10-CM

## 2023-01-30 ENCOUNTER — Ambulatory Visit
Admission: RE | Admit: 2023-01-30 | Discharge: 2023-01-30 | Disposition: A | Discharge status: Home / Self Care | Payer: Managed Care, Other (non HMO) | Source: Ambulatory Visit | Attending: Internal Medicine | Admitting: Internal Medicine

## 2023-01-30 DIAGNOSIS — Z1231 Encounter for screening mammogram for malignant neoplasm of breast: Secondary | ICD-10-CM

## 2023-01-31 ENCOUNTER — Ambulatory Visit (AMBULATORY_SURGERY_CENTER): Payer: Managed Care, Other (non HMO) | Admitting: *Deleted

## 2023-01-31 VITALS — Ht 67.0 in | Wt 161.0 lb

## 2023-01-31 DIAGNOSIS — Z860100 Personal history of colon polyps, unspecified: Secondary | ICD-10-CM

## 2023-01-31 DIAGNOSIS — Z800 Family history of malignant neoplasm of digestive organs: Secondary | ICD-10-CM

## 2023-01-31 DIAGNOSIS — K5130 Ulcerative (chronic) rectosigmoiditis without complications: Secondary | ICD-10-CM

## 2023-01-31 DIAGNOSIS — Z83719 Family history of colon polyps, unspecified: Secondary | ICD-10-CM

## 2023-01-31 MED ORDER — SUTAB 1479-225-188 MG PO TABS: 24.0000 | ORAL_TABLET | ORAL | 0 refills | Status: DC

## 2023-01-31 NOTE — Progress Notes (Signed)
Pt's pre-visit is done over the phone and all paperwork (prep instructions) sent to patient. Pt's name and DOB verified at the beginning of the pre-visit. Pt denies any difficulty with ambulating.  No egg or soy allergy known to patient  No issues known to pt with past sedation with any surgeries or procedures Patient denies ever being intubated Pt has no issues moving head neck or swallowing No FH of Malignant Hyperthermia Pt is not on diet pills Pt is not on home 02  Pt is not on blood thinners  Pt denies issues with constipation  Pt is not on dialysis Pt denies any upcoming cardiac testing Pt encouraged to use to use Singlecare or Goodrx to reduce cost  Patient's chart reviewed by Osvaldo Angst CNRA prior to pre-visit and patient appropriate for the [place].  Pre-visit completed and red dot placed by patient's name on their procedure day (on provider's schedule).  . Visit by phone Pt states her weight is  Instructions reviewed with pt and pt states understanding. Instructed to review again prior to procedure. Pt states they will.  Instructions sent by mail with coupon and by my chart

## 2023-02-01 ENCOUNTER — Encounter: Payer: Self-pay | Admitting: Gastroenterology

## 2023-02-15 ENCOUNTER — Encounter: Payer: Self-pay | Admitting: Gastroenterology

## 2023-02-15 ENCOUNTER — Ambulatory Visit: Payer: Managed Care, Other (non HMO) | Admitting: Gastroenterology

## 2023-02-15 VITALS — BP 90/62 | HR 80 | Ht 67.0 in | Wt 158.4 lb

## 2023-02-15 DIAGNOSIS — Z86010 Personal history of colonic polyps: Secondary | ICD-10-CM | POA: Diagnosis not present

## 2023-02-15 DIAGNOSIS — K5130 Ulcerative (chronic) rectosigmoiditis without complications: Secondary | ICD-10-CM | POA: Diagnosis not present

## 2023-02-15 MED ORDER — DICYCLOMINE HCL 20 MG PO TABS: 20.0000 mg | ORAL_TABLET | Freq: Three times a day (TID) | ORAL | 3 refills | Status: AC | PRN

## 2023-02-15 NOTE — Patient Instructions (Addendum)
If your blood pressure at your visit was 140/90 or greater, please contact your primary care physician to follow up on this.  _______________________________________________________  If you are age 43 or older, your body mass index should be between 23-30. Your Body mass index is 24.81 kg/m. If this is out of the aforementioned range listed, please consider follow up with your Primary Care Provider.  If you are age 60 or younger, your body mass index should be between 19-25. Your Body mass index is 24.81 kg/m. If this is out of the aformentioned range listed, please consider follow up with your Primary Care Provider.   ________________________________________________________  The [facility] providers would like to encourage you to use [facility] to communicate with providers for non-urgent requests or questions.  Due to long hold times on the telephone, sending your provider a message by [facility] may be a faster and more efficient way to get a response.  Please allow 48 business hours for a response.  Please remember that this is for non-urgent requests.  _______________________________________________________  Due to recent changes in healthcare laws, you may see the results of your imaging and laboratory studies on MyChart before your provider has had a chance to review them.  We understand that in some cases there may be results that are confusing or concerning to you. Not all laboratory results come back in the same time frame and the provider may be waiting for multiple results in order to interpret others.  Please give [place] 48 hours in order for your provider to thoroughly review all the results before contacting the office for clarification of your results.    Continue Canasa.  We have sent the following medications to your pharmacy for you to pick up at your convenience: Bentyl  We will request your labs from Dr. Ludwig Clarks office.  Thank you for entrusting me with your care and for choosing  [facility], Dr. Ileene Patrick

## 2023-02-15 NOTE — Progress Notes (Signed)
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 Treated with mesalamine over the years. No hospitalizations since diagnosis. Endoscopies have been done at times of flaring and not when feeling well, or when off medications.    She has no FH of Crohns or colitis. On mother's side, grandmother and aunt had colon cancer.    SINCE LAST VISIT:  42 year old female here for a follow-up visit for her colitis. Her last colonoscopy was in May 2022, she had a 1 cm sessile serrated polyp removed, otherwise no inflammation noted on biopsies.  I had thought she had some very mild distal rectal inflammation but biopsies of this area were normal.   She has been maintained on Canasa for her proctitis for the most part.  She generally tries to take it daily to every other day as best she can.  For the most part she has not had any significant flares since have seen her.  She had some mild symptoms that bothered her with some urgency and mucus back in February, she thinks could be due to stress in her life or diet.  She took some oral mesalamine which she states helped her get over it.  Uses Bentyl as needed but does not use with any significant regularity.  Overall she has been doing well.  When I previously saw her last year we had done an evaluation for epigastric to right upper quadrant pain.  This led to an endoscopy, right upper quadrant ultrasound, and HIDA scan, none of which showed a clear cause for her symptoms.  She had a CT scan which showed some equivocal changes of a appendix but no clear cause for symptoms.  I gave her a trial of omeprazole which seemed to improve this and it has since not come back.  She has not been taking that with any regularity.  She denies any recurrent pain that bothers her.  Overall she is doing really well.  She is been followed by Dr. Jacqulyn Bath for primary care, had some labs done back in October.  Her hemoglobin is normal at 12.5.     Prior work-up: EGD 03/22/22:Esophagogastric landmarks identified. - 1 cm hiatal hernia. - Ectopic gastric mucosa in the upper third of the esophagus. - Normal esophagus otherwise. - A few gastric polyps. Representative sample resected and retrieved. - Normal stomach otherwise - biopsies taken to rule out H pylori - Normal duodenal bulb and second portion of the duodenum. No overt cause of symptoms on EGD. Trial of PPI reasonable to treat any component of dyspepsia. If no improvement would then consider cross sectional imaging.   1. Surgical [P], gastric antrum and gastric body REACTIVE GASTROPATHY NEGATIVE FOR H. PYLORI, INTESTINAL METAPLASIA, DYSPLASIA AND CARCINOMA 2. Surgical [P], gastric polyp, polyp (1) POLYPOID FRAGMENT OF FUNDIC TYPE GASTRIC MUCOSA SHOWING MINIMAL CHRONIC GASTRITIS WITH REACTIVE EPITHELIAL CHANGES AND A LYMPHOID AGGREGATE NEGATIVE FOR H. PYLORI, INTESTINAL METAPLASIA, DYSPLASIA AND CARCINOMA     Colonoscopy 03/02/21 - The perianal and digital rectal examinations were normal. - The terminal ileum appeared normal. - Localized mild inflammation characterized by erythema, granularity and loss of vascularity was found in the cecum (cecal patch). Biopsies were taken with a cold forceps for histology. - A single small angiodysplastic lesion was found in the transverse colon. - A 10 mm polyp was found in the ascending colon. The polyp was flat. The polyp was removed with a cold snare. Resection and retrieval were complete. -  Very mild inflammation characterized by altered vascularity and granularity was found in the distal rectum. Biopsies were taken with a cold forceps for histology. - The exam was otherwise without abnormality. Generally very good control of colitis on present regimen, inflammation in the rectum is quite mild. - Biopsies were taken with a cold forceps in the rectum, in the sigmoid colon and in the descending colon for histology for surveillance.    1. Surgical [P], colon, cecum - COLONIC MUCOSA WITH ENLARGED LYMPHOID AGGREGATES. - NO ACTIVE INFLAMMATION OR GRANULOMA. - NEGATIVE FOR DYSPLASIA. 2. Surgical [P], colon, ascending, polyp (1) - SESSILE SERRATED POLYP WITHOUT CYTOLOGIC DYSPLASIA. 3. Surgical [P], colon, random left colon sites - UNREMARKABLE COLONIC MUCOSA. - NO ACTIVE INFLAMMATION OR GRANULOMAS. - NEGATIVE FOR DYSPLASIA. 4. Surgical [P], colon, rectum - COLONIC MUCOSA WITH ENLARGED LYMPHOID AGGREGATES. - NO ACTIVE INFLAMMATION OR GRANULOMAS. - NEGATIVE FOR DYSPLASIA.   Repeat exam in 2 years   Colonoscopy 06/26/2018 - normal TI, 7mm flat lesion in hepatic flexure - SSP, mild inflammation in distal rectum, small cecal patch, otherwise normal -  Colonoscopy 2007 - left sided colitis Colonoscopy 2010 - active proctitis only Flex sig 2013 - left sided colitis   CT abdomen pelvis with contrast 04/05/22: IMPRESSION: 1. No definite acute findings within the abdomen or pelvis to explain patient's abdominal pain. 2. There are equivocal changes involving the appendix. Specifically, the appendix is upper limits of normal in caliber at 7 mm. Wall thickness of the appendix is also upper limits of normal at 2 mm. The lumen of the proximal and mid appendix is patent containing gas and there is no periappendiceal fat stranding or free fluid.     Past Medical History:  Diagnosis Date   Anemia    Colitis, chronic, ulcerative    Hemorrhoids    Mastitis    Postpartum examination following vaginal delivery (11/9) 09/09/2011     Past Surgical History:  Procedure Laterality Date   BARTHOLIN GLAND CYST EXCISION  2006   Family History  Problem Relation Age of Onset   Colon polyps Mother    Colon cancer Maternal Grandmother        15 yrs ago dx (1998)   Esophageal cancer Neg Hx    Rectal cancer Neg Hx    Stomach cancer Neg Hx    Breast cancer Neg Hx    Social History   Tobacco Use   Smoking status: Never   Smokeless  tobacco: Never  Vaping Use   Vaping Use: Never used  Substance Use Topics   Alcohol use: No   Drug use: No   Current Outpatient Medications  Medication Sig Dispense Refill   dicyclomine (BENTYL) 20 MG tablet Take 1 tablet (20 mg total) by mouth every 8 (eight) hours as needed for spasms. 90 tablet 2   mesalamine (CANASA) 1000 MG suppository Place 1 suppository (1,000 mg total) rectally at bedtime. 90 suppository 1   mesalamine (LIALDA) 1.2 g EC tablet Take 2 tablets (2.4 g total) by mouth daily with breakfast. Please call to schedule an appointment in February 180 tablet 3   Multiple Vitamins-Minerals (ALIVE HAIR, SKIN & NAILS PO) Take by mouth.     Sodium Sulfate-Mag Sulfate-KCl (SUTAB) [phone] MG TABS Take 24 tablets by mouth as directed. 12 tablet 0   VITAMIN D PO Take 2,000 Units by mouth daily.     methocarbamol (ROBAXIN) 500 MG tablet 500 mg 2 (two) times daily as needed. (Patient not taking: Reported on  02/15/2023)     No current facility-administered medications for this visit.   No Known Allergies   Review of Systems: All systems reviewed and negative except where noted in HPI.    MM 3D SCREENING MAMMOGRAM BILATERAL BREAST  Result Date: 01/31/2023 CLINICAL DATA:  Screening. EXAM: DIGITAL SCREENING BILATERAL MAMMOGRAM WITH TOMOSYNTHESIS AND CAD TECHNIQUE: Bilateral screening digital craniocaudal and mediolateral oblique mammograms were obtained. Bilateral screening digital breast tomosynthesis was performed. The images were evaluated with computer-aided detection. COMPARISON:  Previous exam(s). ACR Breast Density Category d: The breasts are extremely dense, which lowers the sensitivity of mammography. FINDINGS: There are no findings suspicious for malignancy. IMPRESSION: No mammographic evidence of malignancy. A result letter of this screening mammogram will be mailed directly to the patient. RECOMMENDATION: Screening mammogram in one year. (Code:SM-B-01Y) BI-RADS CATEGORY  1:  Negative. Electronically Signed   By: Emmaline Kluver M.D.   On: 01/31/2023 16:37    Physical Exam: BP 90/62 (BP Location: Left Arm, Patient Position: Sitting, Cuff Size: Normal)   Pulse 80   Ht  (1.702 m)   Wt 158 lb 6 oz (71.8 kg)   LMP 02/10/2023   BMI 24.81 kg/m  Constitutional: Pleasant,well-developed, female in no acute distress. Neurological: Alert and oriented to person place and time. Skin: Skin is warm and dry. No rashes noted. Psychiatric: Normal mood and affect. Behavior is normal.   ASSESSMENT: 42 y.o. female here for assessment of the following  1. 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 History of colon polyps    Ulcerative proctitis generally managed pretty well with Canasa monotherapy.  Occasionally has used some oral Lialda on top of this if needed, generally no significant flares that would warrant escalation of therapy but has had mild symptoms at times.  She is also had a history of multiple polyps in the past, last was in advance sessile serrated polyp in 2022.  Given duration of her disease and history of polyps we discussed how often she would have a colonoscopy.  Following discussion of risk and benefits she wanted to pursue colonoscopy at this time, will restage her disease on Canasa monotherapy and reassess for colon polyps.  We discussed that if she has more frequent symptoms or disease activity on Canasa, could take Lialda more frequently or add curcumin to mesalamine if she wanted to try that at some point.  Otherwise continue Bentyl as needed.  Labs obtained from PCP, no anemia.  PLAN: - continue Canasa - can add Lialda PRN if needed - discussed adding curcumin if she wants at some point - refill bentyl to use PRN  - surveillance colonoscopy scheduled - labs from PCP - Dr. Sedalia Muta, MD [facility]

## 2023-02-20 ENCOUNTER — Ambulatory Visit: Payer: Managed Care, Other (non HMO) | Admitting: Gastroenterology

## 2023-02-20 ENCOUNTER — Encounter: Payer: Self-pay | Admitting: Gastroenterology

## 2023-02-20 VITALS — BP 99/54 | HR 73 | Temp 98.3°F | Resp 10

## 2023-02-20 DIAGNOSIS — D123 Benign neoplasm of transverse colon: Secondary | ICD-10-CM

## 2023-02-20 DIAGNOSIS — K6289 Other specified diseases of anus and rectum: Secondary | ICD-10-CM

## 2023-02-20 DIAGNOSIS — Z09 Encounter for follow-up examination after completed treatment for conditions other than malignant neoplasm: Secondary | ICD-10-CM | POA: Diagnosis present

## 2023-02-20 DIAGNOSIS — K5130 Ulcerative (chronic) rectosigmoiditis without complications: Secondary | ICD-10-CM | POA: Diagnosis not present

## 2023-02-20 DIAGNOSIS — K635 Polyp of colon: Secondary | ICD-10-CM

## 2023-02-20 DIAGNOSIS — Z86010 Personal history of colonic polyps: Secondary | ICD-10-CM

## 2023-02-20 MED ORDER — SODIUM CHLORIDE 0.9 % IV SOLN: 500.0000 mL | Freq: Once | INTRAVENOUS | Status: DC

## 2023-02-20 NOTE — Progress Notes (Signed)
Sedate, gd SR, tolerated procedure well, VSS, report to RN 

## 2023-02-20 NOTE — Progress Notes (Signed)
Pt's states no medical or surgical changes since previsit or office visit. 

## 2023-02-20 NOTE — Op Note (Addendum)
[facility] Patient Name: Barbara Hall Procedure Date: 02/20/2023 2:43 PM MRN: [id number] Endoscopist: Viviann Spare P. Adela Lank , MD, [id number] Age: 42 Referring MD:  Date of Birth: [birth year] Gender: Female Account #: [id number] Procedure:                Colonoscopy Indications:              Disease activity assessment of chronic ulcerative                            proctosigmoiditis - on Canasa monotherapy,                            clinically doing well, history of colon polyps Medicines:                Monitored Anesthesia Care Procedure:                Pre-Anesthesia Assessment:                           - Prior to the procedure, a History and Physical                            was performed, and patient medications and                            allergies were reviewed. The patient's tolerance of                            previous anesthesia was also reviewed. The risks                            and benefits of the procedure and the sedation                            options and risks were discussed with the patient.                            All questions were answered, and informed consent                            was obtained. Prior Anticoagulants: The patient has                            taken no anticoagulant or antiplatelet agents. ASA                            Grade Assessment: II - A patient with mild systemic                            disease. After reviewing the risks and benefits,                            the patient was deemed in satisfactory condition to  undergo the procedure.                           After obtaining informed consent, the colonoscope                            was passed under direct vision. Throughout the                            procedure, the patient's blood pressure, pulse, and                            oxygen saturations were monitored continuously. The                            Olympus  [id number]-H190DL (#[id number]) Colonoscope was                            introduced through the anus and advanced to the the                            terminal ileum, with identification of the                            appendiceal orifice and IC valve. The colonoscopy                            was performed without difficulty. The patient                            tolerated the procedure well. The quality of the                            bowel preparation was good. The terminal ileum,                            ileocecal valve, appendiceal orifice, and rectum                            were photographed. Scope In: 2:54:06 PM Scope Out: 3:19:22 PM Scope Withdrawal Time: 0 hours 20 minutes 49 seconds  Total Procedure Duration: 0 hours 25 minutes 16 seconds  Findings:                 The perianal and digital rectal examinations were                            normal.                           The terminal ileum appeared normal.                           A diminutive polyp was found in the transverse  colon. The polyp was sessile. The polyp was removed                            with a cold biopsy forceps. Resection and retrieval                            were complete.                           The exam was otherwise without abnormality. No                            active / overt inflammation appreciated.                           Biopsies were taken with a cold forceps in the                            rectum and in the sigmoid colon for histology. Of                            note, one of the rectal biopsy sites had persistent                            oozing despite monitoring for several minutes.                            Snare tip coagulation applied to the site with                            instant hemostasis. Complications:            No immediate complications. Estimated blood loss:                            Minimal. Estimated Blood Loss:      Estimated blood loss was minimal. Impression:               - The examined portion of the ileum was normal.                           - One diminutive polyp in the transverse colon,                            removed with a cold biopsy forceps. Resected and                            retrieved.                           - The examination was otherwise normal. No active                            inflammation.                           -  Biopsies were taken with a cold forceps for                            histology in the rectum and in the sigmoid colon.                           Overall, excellent control of disease on Canasa.                           - The GI Genius (intelligent endoscopy module),                            computer-aided polyp detection system powered by AI                            was utilized to detect colorectal polyps through                            enhanced visualization during colonoscopy. Recommendation:           - Patient has a contact number available for                            emergencies. The signs and symptoms of potential                            delayed complications were discussed with the                            patient. Return to normal activities tomorrow.                            Written discharge instructions were provided to the                            patient.                           - Resume previous diet.                           - Continue present medications.                           - Await pathology results. Viviann Spare P. Adela Lank, MD 02/20/2023 3:25:08 PM This report has been signed electronically.

## 2023-02-20 NOTE — Patient Instructions (Signed)
Await pathology results.  Resume prior diet. Continue present medications.  Handouts on polyps provided.  YOU HAD AN ENDOSCOPIC PROCEDURE TODAY AT THE [facility]:   Refer to the procedure report that was given to you for any specific questions about what was found during the examination.  If the procedure report does not answer your questions, please call your gastroenterologist to clarify.  If you requested that your care partner not be given the details of your procedure findings, then the procedure report has been included in a sealed envelope for you to review at your convenience later.  YOU SHOULD EXPECT: Some feelings of bloating in the abdomen. Passage of more gas than usual.  Walking can help get rid of the air that was put into your GI tract during the procedure and reduce the bloating. If you had a lower endoscopy (such as a colonoscopy or flexible sigmoidoscopy) you may notice spotting of blood in your stool or on the toilet paper. If you underwent a bowel prep for your procedure, you may not have a normal bowel movement for a few days.  Please Note:  You might notice some irritation and congestion in your nose or some drainage.  This is from the oxygen used during your procedure.  There is no need for concern and it should clear up in a day or so.  SYMPTOMS TO REPORT IMMEDIATELY:  Following lower endoscopy (colonoscopy or flexible sigmoidoscopy):  Excessive amounts of blood in the stool  Significant tenderness or worsening of abdominal pains  Swelling of the abdomen that is new, acute  Fever of 100F or higher   For urgent or emergent issues, a gastroenterologist can be reached at any hour by calling [phone]. Do not use MyChart messaging for urgent concerns.    DIET:  We do recommend a small meal at first, but then you may proceed to your regular diet.  Drink plenty of fluids but you should avoid alcoholic beverages for 24 hours.  ACTIVITY:  You should  plan to take it easy for the rest of today and you should NOT DRIVE or use heavy machinery until tomorrow (because of the sedation medicines used during the test).    FOLLOW UP: Our staff will call the number listed on your records the next business day following your procedure.  We will call around 7:15- 8:00 am to check on you and address any questions or concerns that you may have regarding the information given to you following your procedure. If we do not reach you, we will leave a message.     If any biopsies were taken you will be contacted by phone or by letter within the next 1-3 weeks.  Please call us at [phone] if you have not heard about the biopsies in 3 weeks.    SIGNATURES/CONFIDENTIALITY: You and/or your care partner have signed paperwork which will be entered into your electronic medical record.  These signatures attest to the fact that that the information above on your After Visit Summary has been reviewed and is understood.  Full responsibility of the confidentiality of this discharge information lies with you and/or your care-partner.

## 2023-02-20 NOTE — Progress Notes (Signed)
History and Physical Interval Note: see on 02/15/23 - no interval changes. Colonoscopy to reassess colitis on Canasa, and for history of colon polyps. She denies any complaints today, wishes to proceed.    02/20/2023 2:47 PM  Barbara Hall  has presented today for endoscopic procedure(s), with the diagnosis of  Encounter Diagnoses  Name Primary?   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  The various methods of evaluation and treatment have been discussed with the patient and/or family. After consideration of risks, benefits and other options for treatment, the patient has consented to  the endoscopic procedure(s).   The patient's history has been reviewed, patient examined, no change in status, stable for surgery.  I have reviewed the patient's chart and labs.  Questions were answered to the patient's satisfaction.    Harlin Rain, MD [facility]

## 2023-02-20 NOTE — Progress Notes (Signed)
Called to room to assist during endoscopic procedure.  Patient ID and intended procedure confirmed with present staff. Received instructions for my participation in the procedure from the performing physician.  

## 2023-02-21 ENCOUNTER — Telehealth: Payer: Self-pay

## 2023-02-21 NOTE — Telephone Encounter (Signed)
  Follow up Call-     02/20/2023    2:01 PM 03/22/2022    7:19 AM 03/02/2021    9:02 AM  Call back number  Post procedure Call Back phone  # [phone]  Permission to leave phone message Yes Yes Yes     Patient questions:  Do you have a fever, pain , or abdominal swelling? No. Pain Score  0 *  Have you tolerated food without any problems? Yes.    Have you been able to return to your normal activities? Yes.    Do you have any questions about your discharge instructions: Diet   No. Medications  No. Follow up visit  No.  Do you have questions or concerns about your Care? No.  Actions: * If pain score is 4 or above: No action needed, pain <4.

## 2023-04-19 IMAGING — CT CT ABD-PELV W/ CM
2 of 4 series · 15 of 46 positions shown, 17 images · IV contrast (OMNIPAQUE)
Comparison: Abdominal sonogram 01/26/2022

CLINICAL DATA: Two month history of abdominal pain and nausea.

EXAM:
CT ABDOMEN AND PELVIS WITH CONTRAST
TECHNIQUE: Multidetector CT imaging of the abdomen and pelvis was performed
using the standard protocol following bolus administration of
intravenous contrast.

[Series 2: axial st · axial · 0.76mm/px · z∈[-519,-119]mm · 12 of 91 slices shown, 14 images]
[im 6/91  soft-tissue]
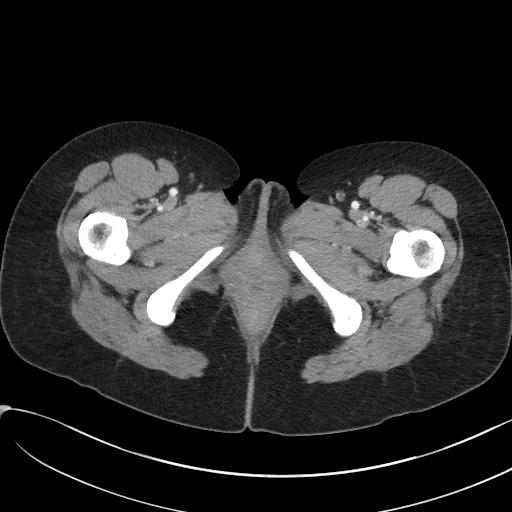
[im 6/91  bone]
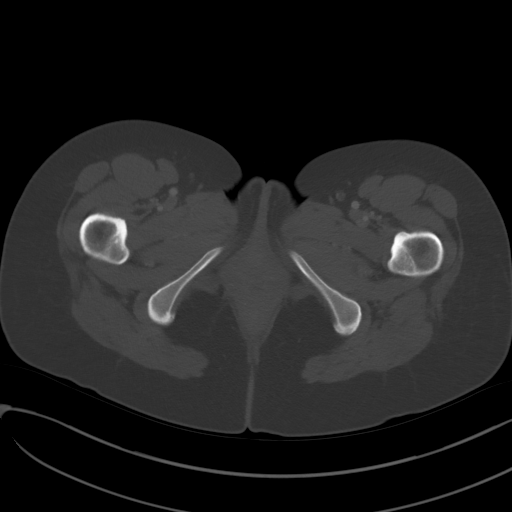
[im 16/91  soft-tissue]
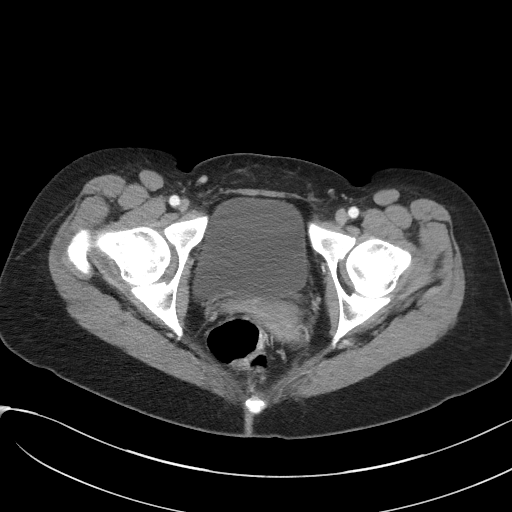
[im 21/91  soft-tissue]
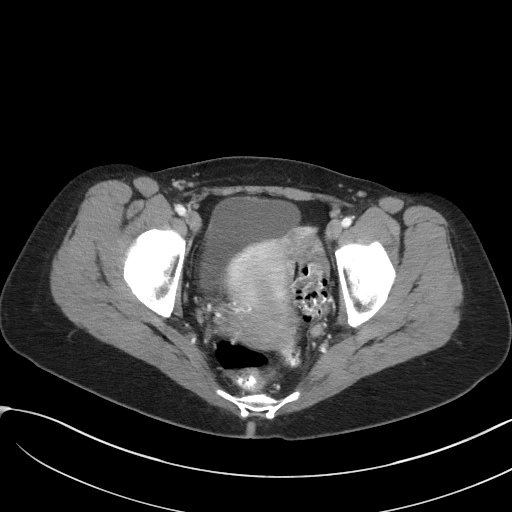
[im 26/91  soft-tissue]
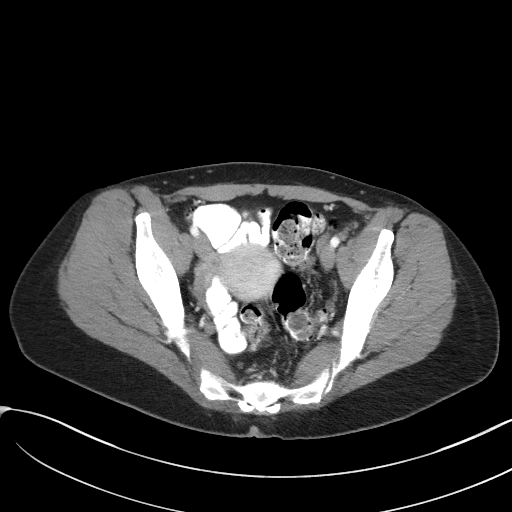
[im 36/91  soft-tissue]
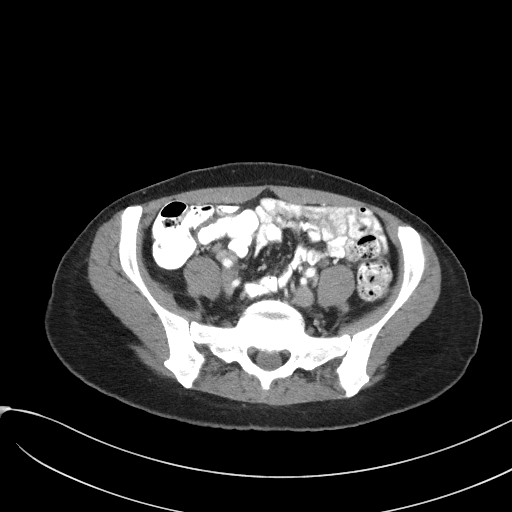
[im 41/91  soft-tissue]
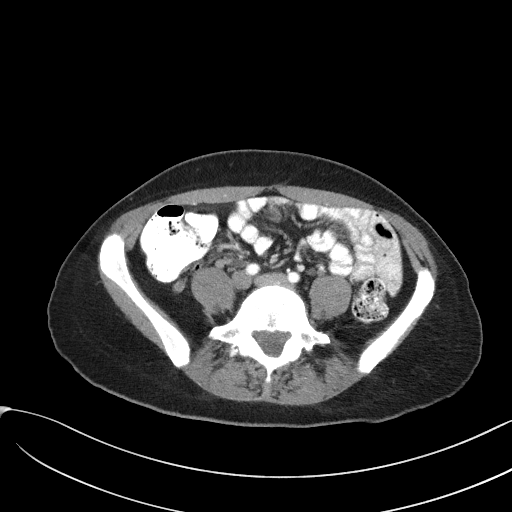
[im 51/91  soft-tissue]
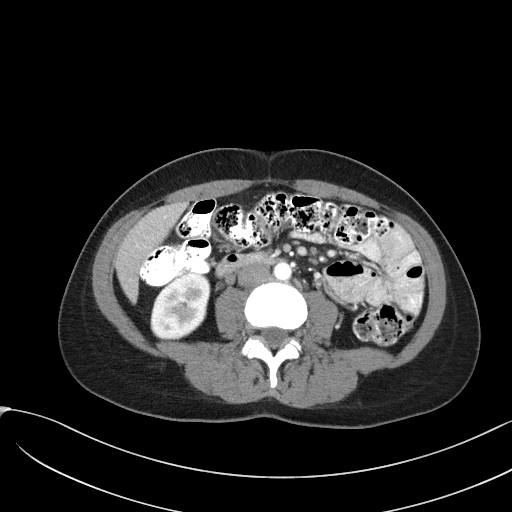
[im 56/91  soft-tissue]
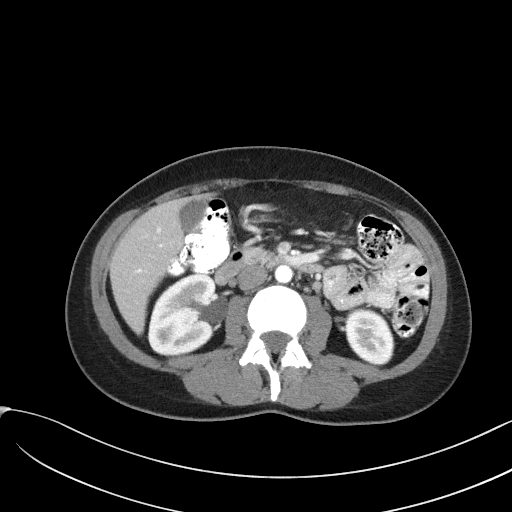
[im 66/91  soft-tissue]
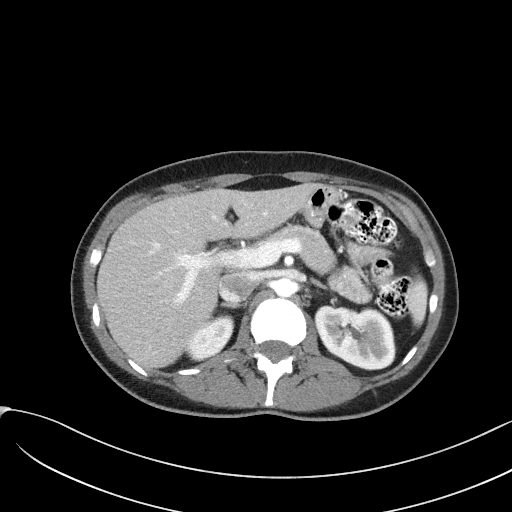
[im 66/91  bone]
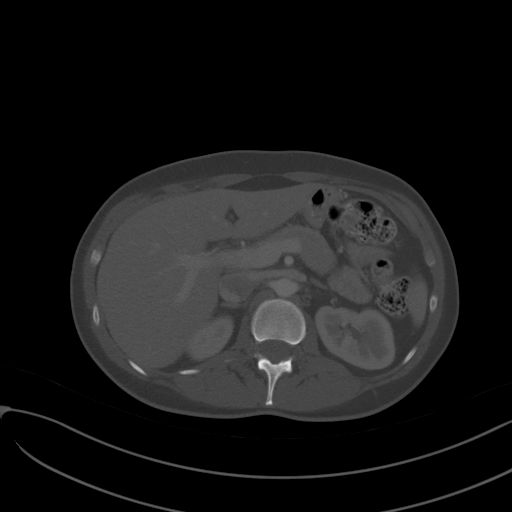
[im 71/91  soft-tissue]
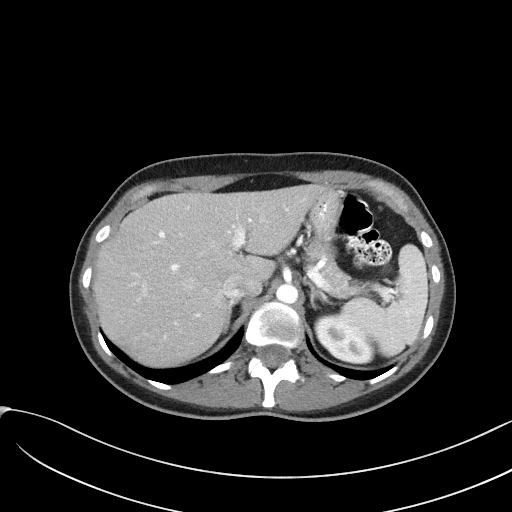
[im 76/91  soft-tissue]
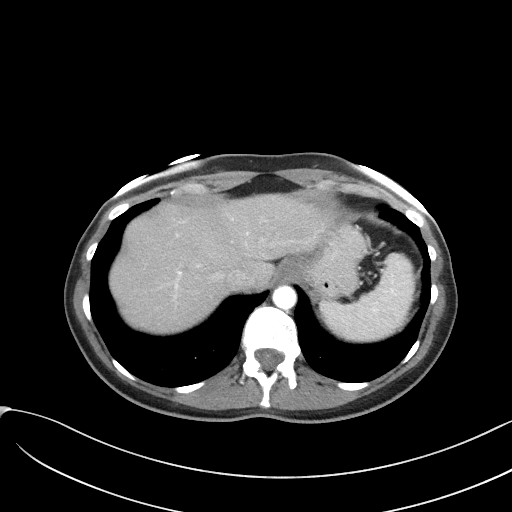
[im 86/91  soft-tissue]
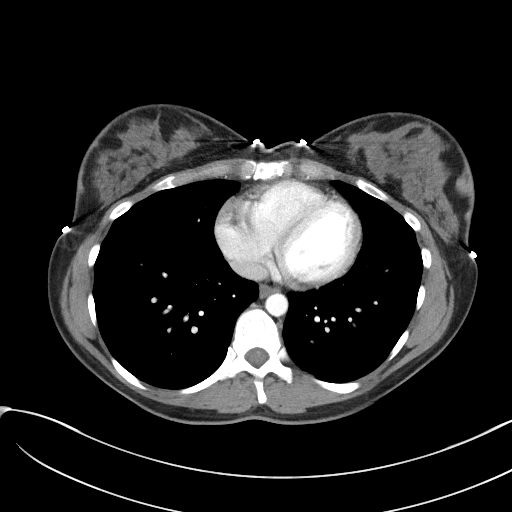

[Series 4: coronal st · coronal · 0.72mm/px · 3 of 78 slices shown]
[im 26/78  soft-tissue]
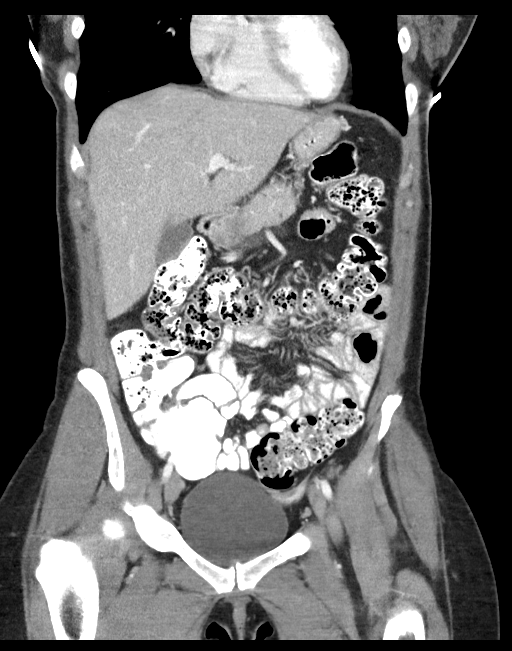
[im 35/78  soft-tissue]
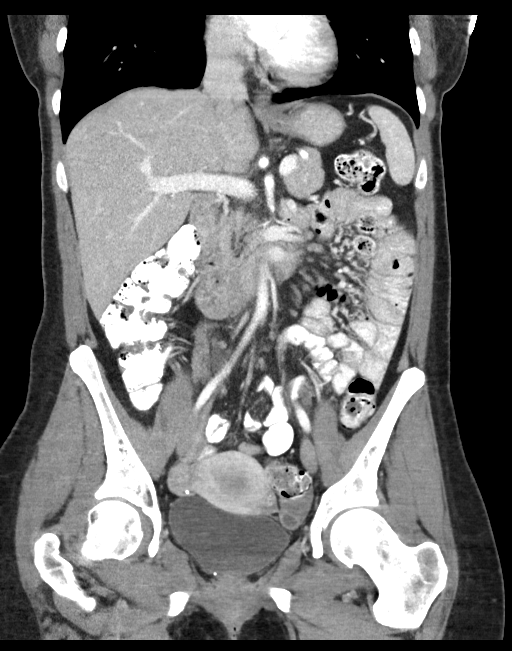
[im 43/78  soft-tissue]
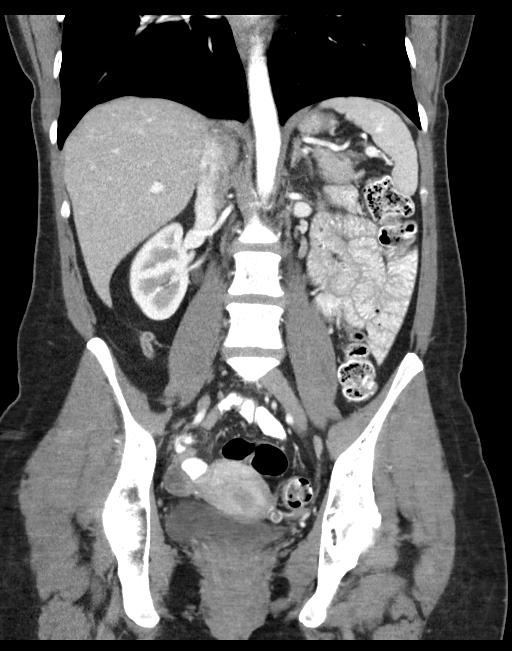

[15 of 46 positions shown; findings below may reference images not displayed]

RADIATION DOSE REDUCTION: This exam was performed according to the
departmental dose-optimization program which includes automated
exposure control, adjustment of the mA and/or kV according to
patient size and/or use of iterative reconstruction technique.

CONTRAST:  100mL OMNIPAQUE IOHEXOL 300 MG/ML  SOLN
FINDINGS: Lower chest: No acute abnormality.

Hepatobiliary: No focal liver abnormality is seen. No gallstones,
gallbladder wall thickening, or biliary dilatation.

Pancreas: Unremarkable. No pancreatic ductal dilatation or
surrounding inflammatory changes.

Spleen: Normal in size without focal abnormality.

Adrenals/Urinary Tract: Adrenal glands are unremarkable. Kidneys are
normal, without renal calculi, focal lesion, or hydronephrosis.
Bladder is unremarkable.

Stomach/Bowel: The stomach appears within normal limits. The
appendix is visualized and is upper limits of normal in caliber
measuring 7 mm, image 42/4. The wall of the appendix is also upper
limits of normal measuring 2 mm. There is enhancement of the
appendiceal wall without inflammatory fat stranding or free fluid.
Gas within the proximal and mid appendiceal lumen confirms patency
of the segments. No periappendiceal fat stranding or free fluid. No
bowel wall thickening, inflammation, or distension identified.

Vascular/Lymphatic: No significant vascular findings are present. No
enlarged abdominal or pelvic lymph nodes.

Reproductive: Uterus and bilateral adnexa are unremarkable.

Other: There is no free fluid or fluid collections identified within
the abdomen or pelvis.

Musculoskeletal: No acute or significant osseous findings.
IMPRESSION: 1. No definite acute findings within the abdomen or pelvis to
explain patient's abdominal pain.
2. There are equivocal changes involving the appendix. Specifically,
the appendix is upper limits of normal in caliber at 7 mm. Wall
thickness of the appendix is also upper limits of normal at 2 mm.
The lumen of the proximal and mid appendix is patent containing gas
and there is no periappendiceal fat stranding or free fluid.

## 2024-05-17 ENCOUNTER — Ambulatory Visit
Admission: RE | Admit: 2024-05-17 | Discharge: 2024-05-17 | Disposition: A | Discharge status: Home / Self Care | Source: Ambulatory Visit | Attending: Nurse Practitioner

## 2024-05-17 VITALS — BP 122/71 | HR 113 | Temp 97.6°F | Resp 18

## 2024-05-17 DIAGNOSIS — N898 Other specified noninflammatory disorders of vagina: Secondary | ICD-10-CM | POA: Insufficient documentation

## 2024-05-17 DIAGNOSIS — Z113 Encounter for screening for infections with a predominantly sexual mode of transmission: Secondary | ICD-10-CM | POA: Diagnosis not present

## 2024-05-17 MED ORDER — FLUCONAZOLE 150 MG PO TABS: ORAL_TABLET | ORAL | 0 refills | Status: AC

## 2024-05-17 NOTE — ED Provider Notes (Signed)
 [facility]    CSN: [id number] Arrival date & time: 05/17/24  0847      History   Chief Complaint Chief Complaint  Patient presents with   SEXUALLY TRANSMITTED DISEASE    Entered by patient    HPI Barbara Hall is a 43 y.o. female.   The history is provided by the patient.   Patient presents with a several day history of vaginal itching.  Patient denies vaginal odor, vaginal discharge, vaginal irritation, urinary symptoms, fever, chills, or abdominal pain.  Patient reports 1 female partner in the past 90 days.  Patient is requesting STI testing.  LMP: 04/26/2024.  Past Medical History:  Diagnosis Date   Anemia    Colitis, chronic, ulcerative (HCC)    Hemorrhoids    Mastitis    Postpartum examination following vaginal delivery (11/9) 09/09/2011    Patient Active Problem List   Diagnosis Date Noted   Chronic GERD 03/06/2022   Left sided colitis (HCC) 12/12/2011   Postpartum examination following vaginal delivery (11/9) 09/09/2011    Past Surgical History:  Procedure Laterality Date   BARTHOLIN GLAND CYST EXCISION  2006    OB History     Gravida  3   Para  2   Term  2   Preterm  0   AB  1   Living  2      SAB  1   IAB      Ectopic      Multiple      Live Births  1            Home Medications    Prior to Admission medications   Medication Sig Start Date End Date Taking? Authorizing Provider  dicyclomine  (BENTYL ) 20 MG tablet Take 1 tablet (20 mg total) by mouth every 8 (eight) hours as needed for spasms. 02/15/23   Armbruster, Elspeth SQUIBB, MD  mesalamine  (CANASA ) 1000 MG suppository Place 1 suppository (1,000 mg total) rectally at bedtime. 08/28/22   Armbruster, Elspeth SQUIBB, MD  mesalamine  (LIALDA ) 1.2 g EC tablet Take 2 tablets (2.4 g total) by mouth daily with breakfast. Please call to schedule an appointment in February 10/31/22   Armbruster, Elspeth SQUIBB, MD  methocarbamol (ROBAXIN) 500 MG tablet 500 mg 2 (two) times daily as  needed. Patient not taking: Reported on 02/15/2023 11/02/22   [provider]  Multiple Vitamins-Minerals (ALIVE HAIR, SKIN & NAILS PO) Take by mouth.    [provider]    Family History Family History  Problem Relation Age of Onset   Colon polyps Mother    Colon cancer Maternal Grandmother        15 yrs ago dx (1998)   Esophageal cancer Neg Hx    Rectal cancer Neg Hx    Stomach cancer Neg Hx    Breast cancer Neg Hx     Social History Social History   Tobacco Use   Smoking status: Never   Smokeless tobacco: Never  Vaping Use   Vaping status: Never Used  Substance Use Topics   Alcohol use: No   Drug use: No     Allergies   Patient has no known allergies.   Review of Systems Review of Systems Per HPI  Physical Exam Triage Vital Signs ED Triage Vitals  Encounter Vitals Group     BP 05/17/24 0852 122/71     Girls Systolic BP Percentile --      Girls Diastolic BP Percentile --  Boys Systolic BP Percentile --      Boys Diastolic BP Percentile --      Pulse Rate 05/17/24 0852 (!) 113     Resp 05/17/24 0852 18     Temp 05/17/24 0852 97.6 F (36.4 C)     Temp Source 05/17/24 0852 Oral     SpO2 05/17/24 0852 95 %     Weight --      Height --      Head Circumference --      Peak Flow --      Pain Score 05/17/24 0853 0     Pain Loc --      Pain Education --      Exclude from Growth Chart --    No data found.  Updated Vital Signs BP 122/71 (BP Location: Right Arm)   Pulse (!) 113   Temp 97.6 F (36.4 C) (Oral)   Resp 18   LMP 04/26/2024 (Exact Date)   SpO2 95%   Visual Acuity Right Eye Distance:   Left Eye Distance:   Bilateral Distance:    Right Eye Near:   Left Eye Near:    Bilateral Near:     Physical Exam Vitals and nursing note reviewed.  Constitutional:      Appearance: Normal appearance.  HENT:     Head: Normocephalic.     Mouth/Throat:     Mouth: Mucous membranes are moist.  Eyes:     Extraocular Movements:  Extraocular movements intact.     Pupils: Pupils are equal, round, and reactive to light.  Cardiovascular:     Rate and Rhythm: Normal rate and regular rhythm.     Pulses: Normal pulses.     Heart sounds: Normal heart sounds.  Pulmonary:     Effort: Pulmonary effort is normal.     Breath sounds: Normal breath sounds.  Abdominal:     General: Bowel sounds are normal.     Palpations: Abdomen is soft.  Genitourinary:    Comments: GU exam deferred, self swab performed   Musculoskeletal:     Cervical back: Normal range of motion.  Lymphadenopathy:     Cervical: No cervical adenopathy.  Skin:    General: Skin is warm and dry.  Neurological:     General: No focal deficit present.     Mental Status: She is alert and oriented to person, place, and time.  Psychiatric:        Mood and Affect: Mood normal.        Behavior: Behavior normal.      UC Treatments / Results  Labs (all labs ordered are listed, but only abnormal results are displayed) Labs Reviewed - No data to display  EKG   Radiology No results found.  Procedures Procedures (including critical care time)  Medications Ordered in UC Medications - No data to display  Initial Impression / Assessment and Plan / UC Course  I have reviewed the triage vital signs and the nursing notes.  Pertinent labs & imaging results that were available during my care of the patient were reviewed by me and considered in my medical decision making (see chart for details).  Will treat vaginal itching with fluconazole  150 mg for possible vaginitis while cytology swab is pending.  Supportive care recommendations were provided and discussed with the patient to include refraining from sexual intercourse until test results have been received, notifying all partners, and refraining from sexual intercourse for an additional 7 days of treatment is recommended.  Patient was in agreement with this plan of care and verbalizes understanding.  All  questions were answered.  Patient stable for discharge.   Final Clinical Impressions(s) / UC Diagnoses   Final diagnoses:  None   Discharge Instructions   None    ED Prescriptions   None    PDMP not reviewed this encounter.   Gilmer Etta PARAS, NP 05/17/24 [phone]

## 2024-05-17 NOTE — ED Triage Notes (Signed)
 Wants to be checked for STDs.  States is having some vaginal itching.

## 2024-05-17 NOTE — Discharge Instructions (Signed)
 Cytology swab is pending.  You will be contacted if the pending test results are abnormal.  You will also access to your results via MyChart. Take medication as prescribed. Recommend refraining from sexual intercourse until test results have been received.  If your cytology swab results are abnormal, please notify all partners. If treatment is recommended, you will need to refrain from sexual intercourse for an additional 7 days after completing treatment. Increase condom use with sexual encounter. Follow-up as needed.

## 2024-05-19 ENCOUNTER — Ambulatory Visit (HOSPITAL_COMMUNITY): Payer: Self-pay

## 2024-05-19 LAB — CERVICOVAGINAL ANCILLARY ONLY
Bacterial Vaginitis (gardnerella): POSITIVE — AB
Candida Glabrata: NEGATIVE
Candida Vaginitis: POSITIVE — AB
Chlamydia: NEGATIVE
Comment: NEGATIVE
Comment: NEGATIVE
Comment: NEGATIVE
Comment: NEGATIVE
Comment: NEGATIVE
Comment: NORMAL
Neisseria Gonorrhea: NEGATIVE
Trichomonas: NEGATIVE

## 2024-05-19 MED ORDER — METRONIDAZOLE 500 MG PO TABS: 500.0000 mg | ORAL_TABLET | Freq: Two times a day (BID) | ORAL | 0 refills | Status: AC
